# Patient Record
Sex: Male | Born: 1975 | Race: White | Hispanic: No | Marital: Married | Smoking: Never smoker
Health system: Southern US, Community
[De-identification: ages and names within clinical notes are randomized; demographics above are authoritative.]

## PROBLEM LIST (undated history)

## (undated) DIAGNOSIS — E538 Deficiency of other specified B group vitamins: Secondary | ICD-10-CM

## (undated) DIAGNOSIS — Q2543 Congenital aneurysm of aorta: Secondary | ICD-10-CM

## (undated) DIAGNOSIS — I719 Aortic aneurysm of unspecified site, without rupture: Secondary | ICD-10-CM

## (undated) DIAGNOSIS — I491 Atrial premature depolarization: Secondary | ICD-10-CM

## (undated) DIAGNOSIS — K760 Fatty (change of) liver, not elsewhere classified: Secondary | ICD-10-CM

## (undated) DIAGNOSIS — Z823 Family history of stroke: Secondary | ICD-10-CM

## (undated) DIAGNOSIS — I493 Ventricular premature depolarization: Secondary | ICD-10-CM

## (undated) DIAGNOSIS — I2510 Atherosclerotic heart disease of native coronary artery without angina pectoris: Secondary | ICD-10-CM

## (undated) DIAGNOSIS — I10 Essential (primary) hypertension: Secondary | ICD-10-CM

## (undated) DIAGNOSIS — I7121 Aneurysm of the ascending aorta, without rupture: Secondary | ICD-10-CM

## (undated) HISTORY — DX: Family history of stroke: Z82.3

## (undated) HISTORY — DX: Deficiency of other specified B group vitamins: E53.8

## (undated) HISTORY — DX: Atherosclerotic heart disease of native coronary artery without angina pectoris: I25.10

## (undated) HISTORY — PX: WISDOM TOOTH EXTRACTION: SHX21

## (undated) HISTORY — DX: Atrial premature depolarization: I49.1

## (undated) HISTORY — DX: Fatty (change of) liver, not elsewhere classified: K76.0

## (undated) HISTORY — PX: KNEE ARTHROSCOPY: SUR90

## (undated) HISTORY — DX: Essential (primary) hypertension: I10

## (undated) HISTORY — DX: Ventricular premature depolarization: I49.3

---

## 2005-06-10 ENCOUNTER — Encounter: Admission: RE | Admit: 2005-06-10 | Discharge: 2005-06-10 | Payer: Self-pay | Admitting: Family Medicine

## 2017-05-12 DIAGNOSIS — M25562 Pain in left knee: Secondary | ICD-10-CM | POA: Diagnosis not present

## 2017-05-21 DIAGNOSIS — M25562 Pain in left knee: Secondary | ICD-10-CM | POA: Diagnosis not present

## 2017-08-12 DIAGNOSIS — M1712 Unilateral primary osteoarthritis, left knee: Secondary | ICD-10-CM | POA: Diagnosis not present

## 2017-08-14 DIAGNOSIS — E538 Deficiency of other specified B group vitamins: Secondary | ICD-10-CM | POA: Diagnosis not present

## 2017-08-14 DIAGNOSIS — R002 Palpitations: Secondary | ICD-10-CM | POA: Diagnosis not present

## 2017-08-14 DIAGNOSIS — I10 Essential (primary) hypertension: Secondary | ICD-10-CM | POA: Diagnosis not present

## 2017-08-15 ENCOUNTER — Telehealth: Payer: Self-pay

## 2017-08-15 NOTE — Telephone Encounter (Signed)
SENT NOTES TO SCHEDULING 

## 2017-08-19 DIAGNOSIS — M1712 Unilateral primary osteoarthritis, left knee: Secondary | ICD-10-CM | POA: Diagnosis not present

## 2017-08-26 DIAGNOSIS — M1712 Unilateral primary osteoarthritis, left knee: Secondary | ICD-10-CM | POA: Diagnosis not present

## 2017-09-04 ENCOUNTER — Encounter: Payer: Self-pay | Admitting: Cardiology

## 2017-09-04 ENCOUNTER — Encounter (INDEPENDENT_AMBULATORY_CARE_PROVIDER_SITE_OTHER): Payer: Self-pay

## 2017-09-04 ENCOUNTER — Ambulatory Visit: Payer: BLUE CROSS/BLUE SHIELD | Admitting: Cardiology

## 2017-09-04 VITALS — BP 120/100 | HR 71 | Ht 70.25 in | Wt 190.0 lb

## 2017-09-04 DIAGNOSIS — Z8249 Family history of ischemic heart disease and other diseases of the circulatory system: Secondary | ICD-10-CM

## 2017-09-04 DIAGNOSIS — R002 Palpitations: Secondary | ICD-10-CM | POA: Diagnosis not present

## 2017-09-04 DIAGNOSIS — R0609 Other forms of dyspnea: Secondary | ICD-10-CM

## 2017-09-04 DIAGNOSIS — I10 Essential (primary) hypertension: Secondary | ICD-10-CM | POA: Diagnosis not present

## 2017-09-04 NOTE — Progress Notes (Signed)
ekg 

## 2017-09-04 NOTE — Patient Instructions (Addendum)
Medication Instructions:   Your physician recommends that you continue on your current medications as directed. Please refer to the Current Medication list given to you today.     Testing/Procedures:  Your physician has requested that you have an echocardiogram. Echocardiography is a painless test that uses sound waves to create images of your heart. It provides your doctor with information about the size and shape of your heart and how well your heart's chambers and valves are working. This procedure takes approximately one hour. There are no restrictions for this procedure.   Your physician has recommended that you wear a 48 HOUR holter monitor. Holter monitors are medical devices that record the heart's electrical activity. Doctors most often use these monitors to diagnose arrhythmias. Arrhythmias are problems with the speed or rhythm of the heartbeat. The monitor is a small, portable device. You can wear one while you do your normal daily activities. This is usually used to diagnose what is causing palpitations/syncope (passing out).    Follow-Up:  3 MONTHS WITH DR Jens Som ON 10:40 AM END SLOT ON 12/11/17       If you need a refill on your cardiac medications before your next appointment, please call your pharmacy.

## 2017-09-04 NOTE — Progress Notes (Signed)
Cardiology Office Note:    Date:  09/04/2017   ID:  William Garrison, DOB 10/18/1975, MRN [id number]  PCP:  London Pepper, MD  Cardiologist:  No primary care provider on file.   Referring MD: London Pepper, MD   Chief Complaint  Patient presents with  . Palpitations   History of Present Illness:    William Garrison is a 42 y.o. male with a hx of hypertension, who is being referred by her primary care physician for palpitations. He has not noticed them for a couple years now, they're intermittent, sometimes they can last on and off for several hours like yesterday, and sometimes associated with shortness of breath. He exercises daily and doesn't have any chest pain or shortness of breath during exercise, he would feel palpitation mostly in the post exercise period. He denies any syncope but his palpitations are very comfortable he feels like to take over his breath sometimes he felt dizzy. His father had stents placed in his 75s, and also has ascending aortic aneurysm. His grandfather had a stroke in his 89s. He has never smoked.  Past Medical History:  Diagnosis Date  . Family history of stroke   . Hypertension   . Vitamin B 12 deficiency     No past surgical history on file.  Current Medications: Current Meds  Medication Sig  . Cyanocobalamin (VITAMIN B 12 PO) Take 2,500 mcg by mouth daily.  . Desloratadine (CLARINEX PO) Take by mouth.  . Ibuprofen (ADVIL PO) Take by mouth.  Marland Kitchen lisinopril (PRINIVIL,ZESTRIL) 10 MG tablet Take 10 mg by mouth daily.     Allergies:   Patient has no known allergies.   Social History   Socioeconomic History  . Marital status: Married    Spouse name: None  . Number of children: None  . Years of education: None  . Highest education level: None  Social Needs  . Financial resource strain: None  . Food insecurity - worry: None  . Food insecurity - inability: None  . Transportation needs - medical: None  . Transportation needs - non-medical: None    Occupational History  . None  Tobacco Use  . Smoking status: Never Smoker  . Smokeless tobacco: Never Used  Substance and Sexual Activity  . Alcohol use: Yes    Alcohol/week: 4.2 - 8.4 oz    Types: 7 - 14 Glasses of wine per week  . Drug use: No  . Sexual activity: None    Comment: married  Other Topics Concern  . None  Social History Narrative  . None     Family History: The patient's family history includes Aortic aneurysm in his father; Hypercalcemia in his mother; Hypertension in his father and mother; Stroke in his paternal grandfather.  ROS:   Please see the history of present illness.    All other systems reviewed and are negative.  EKGs/Labs/Other Studies Reviewed:    The following studies were reviewed today:  EKG:  EKG is ordered today.  The ekg ordered today demonstrates normal sinus rhythm, normal EKG, no prior EKG available for comparison  Recent Labs: No results found for requested labs within last 8760 hours.  Recent Lipid Panel No results found for: CHOL, TRIG, HDL, CHOLHDL, VLDL, LDLCALC, LDLDIRECT  Physical Exam:    VS:  BP (!) 120/100 (BP Location: Left Arm, Patient Position: Sitting, Cuff Size: Normal)   Pulse 71   Ht 5' 10.25" (1.784 m)   Wt 190 lb (86.2 kg)   SpO2 99%  BMI 27.07 kg/m     Wt Readings from Last 3 Encounters:  09/04/17 190 lb (86.2 kg)     GEN: Well nourished, well developed in no acute distress HEENT: Normal NECK: No JVD; No carotid bruits LYMPHATICS: No lymphadenopathy CARDIAC: RRR, no murmurs, rubs, gallops RESPIRATORY:  Clear to auscultation without rales, wheezing or rhonchi  ABDOMEN: Soft, non-tender, non-distended MUSCULOSKELETAL:  No edema; No deformity  SKIN: Warm and dry NEUROLOGIC:  Alert and oriented x 3 PSYCHIATRIC:  Normal affect   ASSESSMENT:    1. Palpitations   2. DOE (dyspnea on exertion)   3. Essential hypertension   4. Family history of early CAD    PLAN:    In order of problems listed  above:  1. Palpitations, most probably PVCs, he brought blood work from his primary care physician, all of his labs are normal including electrolytes kidney function and TSH. We will obtain a 48-hour Holter monitor to further evaluate. We will also obtain echocardiogram to evaluate for his LVEF and also evaluate for possible ascending aortic aneurysm as his family history in his father. 2. Dyspnea on exertion, only associated with palpitations, we will not proceed with stress test unless abnormal echocardiogram. 3. Hypertension - well-controlled with lisinopril. 4. Lipids were checked previously and all at goal.   Medication Adjustments/Labs and Tests Ordered: Current medicines are reviewed at length with the patient today.  Concerns regarding medicines are outlined above.  Orders Placed This Encounter  Procedures  . Holter monitor - 48 hour  . EKG 12-Lead  . ECHOCARDIOGRAM COMPLETE   No orders of the defined types were placed in this encounter.   Signed, Ena Dawley, MD  09/04/2017 9:32 AM    [facility]

## 2017-09-09 DIAGNOSIS — J209 Acute bronchitis, unspecified: Secondary | ICD-10-CM | POA: Diagnosis not present

## 2017-09-09 DIAGNOSIS — R509 Fever, unspecified: Secondary | ICD-10-CM | POA: Diagnosis not present

## 2017-09-16 ENCOUNTER — Other Ambulatory Visit: Payer: Self-pay

## 2017-09-16 ENCOUNTER — Ambulatory Visit (INDEPENDENT_AMBULATORY_CARE_PROVIDER_SITE_OTHER): Payer: BLUE CROSS/BLUE SHIELD

## 2017-09-16 ENCOUNTER — Ambulatory Visit (HOSPITAL_COMMUNITY): Payer: BLUE CROSS/BLUE SHIELD | Attending: Cardiovascular Disease

## 2017-09-16 DIAGNOSIS — I10 Essential (primary) hypertension: Secondary | ICD-10-CM | POA: Insufficient documentation

## 2017-09-16 DIAGNOSIS — R0600 Dyspnea, unspecified: Secondary | ICD-10-CM | POA: Diagnosis not present

## 2017-09-16 DIAGNOSIS — R0609 Other forms of dyspnea: Secondary | ICD-10-CM

## 2017-09-16 DIAGNOSIS — R002 Palpitations: Secondary | ICD-10-CM

## 2017-09-17 ENCOUNTER — Telehealth: Payer: Self-pay | Admitting: Cardiology

## 2017-09-17 NOTE — Telephone Encounter (Signed)
William Garrison is returning a call . Thanks

## 2017-09-17 NOTE — Telephone Encounter (Signed)
Notes recorded by Nuala Alpha, LPN on 1/96/2229 at 7:98 AM EDT Notified the pt of echo results per Dr Meda Coffee. Pt verbalized understanding.

## 2017-10-22 DIAGNOSIS — M1712 Unilateral primary osteoarthritis, left knee: Secondary | ICD-10-CM | POA: Diagnosis not present

## 2017-12-11 ENCOUNTER — Ambulatory Visit: Payer: BLUE CROSS/BLUE SHIELD | Admitting: Cardiology

## 2018-01-13 DIAGNOSIS — R002 Palpitations: Secondary | ICD-10-CM | POA: Insufficient documentation

## 2018-01-13 DIAGNOSIS — Z8249 Family history of ischemic heart disease and other diseases of the circulatory system: Secondary | ICD-10-CM | POA: Insufficient documentation

## 2018-01-13 NOTE — Progress Notes (Signed)
Cardiology Office Note    Date:  01/14/2018   ID:  William Garrison, DOB [birth year], MRN [id number]  PCP:  London Pepper, MD  Cardiologist: Ena Dawley, MD  Chief Complaint  Patient presents with  . Follow-up    History of Present Illness:  William Garrison is a 42 y.o. 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   Patient was seen by Dr. Meda Coffee 08/2017 2D echo showed normal LV function, mildly dilated aortic root, trace of AI MR and TR.  48-hour monitor showed sinus bradycardia to sinus tachycardia with occasional PACs and PVCs felt to be essentially normal.  Patient comes in today for follow-up.  He continues to have daily palpitations but not as much as he had in the past.  He is not interested in a beta-blocker as his blood pressure is well controlled on lisinopril.  He works out [employer] camp and other activities regularly.  Previously ran marathons but can no longer do that because of knee problems.  Denies chest pain, dyspnea, dizziness or presyncope.  Only drinks 1 cup of coffee daily.    Past Medical History:  Diagnosis Date  . Family history of stroke   . Hypertension   . Vitamin B 12 deficiency     History reviewed. No pertinent surgical history.  Current Medications: Current Meds  Medication Sig  . Cyanocobalamin (VITAMIN B 12 PO) Take 2,500 mcg by mouth daily.  . Desloratadine (CLARINEX PO) Take by mouth.  . Ibuprofen (ADVIL PO) Take by mouth.  Marland Kitchen lisinopril (PRINIVIL,ZESTRIL) 10 MG tablet Take 10 mg by mouth daily.     Allergies:   Patient has no known allergies.   Social History   Socioeconomic History  . Marital status: Married    Spouse name: Not on file  . Number of children: Not on file  . Years of education: Not on file  . Highest education level: Not on file  Occupational  History  . Not on file  Social Needs  . Financial resource strain: Not on file  . Food insecurity:    Worry: Not on file    Inability: Not on file  . Transportation needs:    Medical: Not on file    Non-medical: Not on file  Tobacco Use  . Smoking status: Never Smoker  . Smokeless tobacco: Never Used  Substance and Sexual Activity  . Alcohol use: Yes    Alcohol/week: 4.2 - 8.4 oz    Types: 7 - 14 Glasses of wine per week  . Drug use: No  . Sexual activity: Not on file    Comment: married  Lifestyle  . Physical activity:    Days per week: Not on file    Minutes per session: Not on file  . Stress: Not on file  Relationships  . Social connections:    Talks on phone: Not on file    Gets together: Not on file    Attends religious service: Not on file    Active member of club or organization: Not on file    Attends meetings of clubs or organizations: Not on file    Relationship status: Not on file  Other Topics Concern  . Not on file  Social History Narrative  . Not on file     Family History:  The patient's family history includes Aortic  aneurysm in his father; Hypercalcemia in his mother; Hypertension in his father and mother; Stroke in his paternal grandfather.   ROS:   Please see the history of present illness.    Review of Systems  Constitution: Negative.  HENT: Negative.   Cardiovascular: Positive for palpitations.  Respiratory: Negative.   Endocrine: Negative.   Hematologic/Lymphatic: Negative.   Musculoskeletal: Negative.   Gastrointestinal: Negative.   Genitourinary: Negative.   Neurological: Negative.    All other systems reviewed and are negative.   PHYSICAL EXAM:   VS:  BP 118/72   Pulse 65   Ht 6' (1.829 m)   Wt 180 lb (81.6 kg)   SpO2 98%   BMI 24.41 kg/m   Physical Exam  GEN: Thin, in no acute distress  Neck: no JVD, carotid bruits, or masses Cardiac:RRR; no murmurs, rubs, or gallops  Respiratory:  clear to auscultation bilaterally, normal  work of breathing GI: soft, nontender, nondistended, + BS Ext: without cyanosis, clubbing, or edema, Good distal pulses bilaterally Neuro:  Alert and Oriented x 3 Psych: euthymic mood, full affect  Wt Readings from Last 3 Encounters:  01/14/18 180 lb (81.6 kg)  09/04/17 190 lb (86.2 kg)      Studies/Labs Reviewed:   EKG:  EKG is not ordered today.   Recent Labs: No results found for requested labs within last 8760 hours.   Lipid Panel No results found for: CHOL, TRIG, HDL, CHOLHDL, VLDL, LDLCALC, LDLDIRECT  Additional studies/ records that were reviewed today include:  2D echo 09/16/2017 Study Conclusions   - Left ventricle: The cavity size was normal. Wall thickness was   normal. Systolic function was normal. The estimated ejection   fraction was in the range of 55% to 60%. Wall motion was normal;   there were no regional wall motion abnormalities. Left   ventricular diastolic function parameters were normal. - Aortic valve: There was trivial regurgitation. - Aortic root: The aortic root was mildly dilated. - Mitral valve: Calcified annulus.   Impressions:   - Normal LV function; mildly dilated aortic root; trace AI, MR and   TR.  Holter monitor 09/16/2017 Sinus bradycardia to sinus tachycardia. Ocassional PACs and PVCs. Essentially a normal 48 hour Holter monitor.      ASSESSMENT:    1. Dilated aortic root (HCC)   2. Palpitations   3. Family history of early CAD      PLAN:  In order of problems listed above:  Palpitations associated with dyspnea 2D echo normal LV function, mildly dilated aortic root 48-hour monitor showed PACs and PVCs.  Lipids previously at goal.  Palpitations have improved but still has some on a daily basis.  Not interested in beta-blocker at this time.  Family history of early CAD with family history of early CAD and dilated aortic root on echo will order CT with calcium score for next March and follow-up with Dr. Meda Coffee  Essential  hypertension controlled with lisinopril  Dilated aortic root-monitor yearly  Medication Adjustments/Labs and Tests Ordered: Current medicines are reviewed at length with the patient today.  Concerns regarding medicines are outlined above.  Medication changes, Labs and Tests ordered today are listed in the Patient Instructions below. Patient Instructions  Medication Instructions:  Your physician recommends that you continue on your current medications as directed. Please refer to the Current Medication list given to you today.   Labwork: None ordered today; However you will need lab work done March 2020 1 week before CT.   Testing/Procedures:  Coronary CT-A to be done at [place]: Your physician recommends that you schedule a follow-up appointment with Dr. Meda Coffee after testing is complete in March 2020  Any Other Special Instructions Will Be Listed Below (If Applicable).  If you need a refill on your cardiac medications before your next appointment, please call your pharmacy.  Please arrive at the [facility] main entrance of [facility] at xx:xx AM (30-45 minutes prior to test start time)  [facility] [place], [place] [address] [phone]  Proceed to the [facility] Radiology Department (First Floor).  Please follow these instructions carefully (unless otherwise directed):  Hold all erectile dysfunction medications at least 48 hours prior to test.  On the Night Before the Test: . Drink plenty of water. . Do not consume any caffeinated/decaffeinated beverages or chocolate 12 hours prior to your test. . Do not take any antihistamines 12 hours prior to your test. . If you take Metformin do not take 24 hours prior to test. . If the patient has contrast allergy: ? Patient will need a prescription for Prednisone and very clear instructions (as follows): 1. Prednisone 50 mg - take 13 hours prior to test 2. Take another  Prednisone 50 mg 7 hours prior to test 3. Take another Prednisone 50 mg 1 hour prior to test 4. Take Benadryl 50 mg 1 hour prior to test . Patient must complete all four doses of above prophylactic medications. . Patient will need a ride after test due to Benadryl.  On the Day of the Test: . Drink plenty of water. Do not drink any water within one hour of the test. . Do not eat any food 4 hours prior to the test. . You may take your regular medications prior to the test. . IF NOT ON A BETA BLOCKER - Take 50 mg of lopressor (metoprolol) one hour before the test.   After the Test: . Drink plenty of water. . After receiving IV contrast, you may experience a mild flushed feeling. This is normal. . On occasion, you may experience a mild rash up to 24 hours after the test. This is not dangerous. If this occurs, you can take Benadryl 25 mg and increase your fluid intake. . If you experience trouble breathing, this can be serious. If it is severe call 911 IMMEDIATELY. If it is mild, please call our office. . If you take any of these medications: Glipizide/Metformin, Avandament, Glucavance, please do not take 48 hours after completing test.       Signed, Ermalinda Barrios, PA-C  01/14/2018 10:26 AM    [address], [place], [place]  [address] Phone: [phone]; Fax: [phone]

## 2018-01-14 ENCOUNTER — Ambulatory Visit: Payer: BLUE CROSS/BLUE SHIELD | Admitting: Physician Assistant

## 2018-01-14 ENCOUNTER — Encounter: Payer: Self-pay | Admitting: Physician Assistant

## 2018-01-14 VITALS — BP 118/72 | HR 65 | Ht 72.0 in | Wt 180.0 lb

## 2018-01-14 DIAGNOSIS — R002 Palpitations: Secondary | ICD-10-CM | POA: Diagnosis not present

## 2018-01-14 DIAGNOSIS — I77810 Thoracic aortic ectasia: Secondary | ICD-10-CM

## 2018-01-14 DIAGNOSIS — Z8249 Family history of ischemic heart disease and other diseases of the circulatory system: Secondary | ICD-10-CM

## 2018-01-14 MED ORDER — METOPROLOL TARTRATE 50 MG PO TABS: 50.0000 mg | ORAL_TABLET | Freq: Once | ORAL | 0 refills | Status: DC

## 2018-01-14 NOTE — Patient Instructions (Addendum)
Medication Instructions:  Your physician recommends that you continue on your current medications as directed. Please refer to the Current Medication list given to you today.   Labwork: None ordered today; However you will need lab work done March 2020 1 week before CT.   Testing/Procedures: Coronary CT-A to be done at [place]: Your physician recommends that you schedule a follow-up appointment with Dr. Meda Coffee after testing is complete in March 2020  Any Other Special Instructions Will Be Listed Below (If Applicable).  If you need a refill on your cardiac medications before your next appointment, please call your pharmacy.  Please arrive at the [facility] main entrance of [facility] at xx:xx AM (30-45 minutes prior to test start time)  [facility] [address] [place], [place] [address] [phone]  Proceed to the [facility] Radiology Department (First Floor).  Please follow these instructions carefully (unless otherwise directed):  Hold all erectile dysfunction medications at least 48 hours prior to test.  On the Night Before the Test: . Drink plenty of water. . Do not consume any caffeinated/decaffeinated beverages or chocolate 12 hours prior to your test. . Do not take any antihistamines 12 hours prior to your test. . If you take Metformin do not take 24 hours prior to test. . If the patient has contrast allergy: ? Patient will need a prescription for Prednisone and very clear instructions (as follows): 1. Prednisone 50 mg - take 13 hours prior to test 2. Take another Prednisone 50 mg 7 hours prior to test 3. Take another Prednisone 50 mg 1 hour prior to test 4. Take Benadryl 50 mg 1 hour prior to test . Patient must complete all four doses of above prophylactic medications. . Patient will need a ride after test due to Benadryl.  On the Day of the Test: . Drink plenty of water. Do not drink any water within one hour  of the test. . Do not eat any food 4 hours prior to the test. . You may take your regular medications prior to the test. . IF NOT ON A BETA BLOCKER - Take 50 mg of lopressor (metoprolol) one hour before the test.   After the Test: . Drink plenty of water. . After receiving IV contrast, you may experience a mild flushed feeling. This is normal. . On occasion, you may experience a mild rash up to 24 hours after the test. This is not dangerous. If this occurs, you can take Benadryl 25 mg and increase your fluid intake. . If you experience trouble breathing, this can be serious. If it is severe call 911 IMMEDIATELY. If it is mild, please call our office. . If you take any of these medications: Glipizide/Metformin, Avandament, Glucavance, please do not take 48 hours after completing test.

## 2018-03-18 DIAGNOSIS — M1712 Unilateral primary osteoarthritis, left knee: Secondary | ICD-10-CM | POA: Diagnosis not present

## 2018-03-25 DIAGNOSIS — M1712 Unilateral primary osteoarthritis, left knee: Secondary | ICD-10-CM | POA: Diagnosis not present

## 2018-04-01 DIAGNOSIS — M1712 Unilateral primary osteoarthritis, left knee: Secondary | ICD-10-CM | POA: Diagnosis not present

## 2018-05-11 DIAGNOSIS — M1712 Unilateral primary osteoarthritis, left knee: Secondary | ICD-10-CM | POA: Diagnosis not present

## 2018-06-28 DIAGNOSIS — S62309A Unspecified fracture of unspecified metacarpal bone, initial encounter for closed fracture: Secondary | ICD-10-CM | POA: Diagnosis not present

## 2018-06-28 DIAGNOSIS — Z23 Encounter for immunization: Secondary | ICD-10-CM | POA: Diagnosis not present

## 2018-06-29 DIAGNOSIS — M7989 Other specified soft tissue disorders: Secondary | ICD-10-CM | POA: Diagnosis not present

## 2018-06-29 DIAGNOSIS — S62336A Displaced fracture of neck of fifth metacarpal bone, right hand, initial encounter for closed fracture: Secondary | ICD-10-CM | POA: Diagnosis not present

## 2018-06-29 DIAGNOSIS — S62336D Displaced fracture of neck of fifth metacarpal bone, right hand, subsequent encounter for fracture with routine healing: Secondary | ICD-10-CM | POA: Diagnosis not present

## 2018-06-29 DIAGNOSIS — M79641 Pain in right hand: Secondary | ICD-10-CM | POA: Diagnosis not present

## 2018-07-09 DIAGNOSIS — S62336A Displaced fracture of neck of fifth metacarpal bone, right hand, initial encounter for closed fracture: Secondary | ICD-10-CM | POA: Diagnosis not present

## 2018-07-23 DIAGNOSIS — S62336A Displaced fracture of neck of fifth metacarpal bone, right hand, initial encounter for closed fracture: Secondary | ICD-10-CM | POA: Diagnosis not present

## 2018-07-30 DIAGNOSIS — J069 Acute upper respiratory infection, unspecified: Secondary | ICD-10-CM | POA: Diagnosis not present

## 2018-08-12 DIAGNOSIS — M79671 Pain in right foot: Secondary | ICD-10-CM | POA: Diagnosis not present

## 2018-08-18 DIAGNOSIS — S62336A Displaced fracture of neck of fifth metacarpal bone, right hand, initial encounter for closed fracture: Secondary | ICD-10-CM | POA: Diagnosis not present

## 2018-09-02 MED ORDER — METOPROLOL TARTRATE 50 MG PO TABS: 50.0000 mg | ORAL_TABLET | Freq: Once | ORAL | 0 refills | Status: DC

## 2018-09-03 ENCOUNTER — Other Ambulatory Visit: Payer: BLUE CROSS/BLUE SHIELD | Admitting: *Deleted

## 2018-09-03 DIAGNOSIS — I77810 Thoracic aortic ectasia: Secondary | ICD-10-CM | POA: Diagnosis not present

## 2018-09-03 DIAGNOSIS — Z8249 Family history of ischemic heart disease and other diseases of the circulatory system: Secondary | ICD-10-CM

## 2018-09-03 LAB — BASIC METABOLIC PANEL
BUN / CREAT RATIO: 10 (ref 9–20)
BUN: 9 mg/dL (ref 6–24)
CALCIUM: 9.9 mg/dL (ref 8.7–10.2)
CO2: 24 mmol/L (ref 20–29)
Chloride: 98 mmol/L (ref 96–106)
Creatinine, Ser: 0.9 mg/dL (ref 0.76–1.27)
GFR calc Af Amer: 121 mL/min/{1.73_m2} (ref >59)
GFR calc non Af Amer: 105 mL/min/{1.73_m2} (ref >59)
Glucose: 90 mg/dL (ref 65–99)
Potassium: 4.7 mmol/L (ref 3.5–5.2)
SODIUM: 138 mmol/L (ref 134–144)

## 2018-09-07 ENCOUNTER — Telehealth (HOSPITAL_COMMUNITY): Payer: Self-pay | Admitting: Emergency Medicine

## 2018-09-07 NOTE — Telephone Encounter (Signed)
Reaching out to patient to offer assistance regarding upcoming cardiac imaging study; pt verbalizes understanding of appt date/time, parking situation and where to check in, pre-test NPO status and medications ordered, and verified current allergies; name and call back number provided for further questions should they arise Akyra Bouchie RN Navigator Cardiac Imaging [facility] [phone] office [phone] cell 

## 2018-09-08 ENCOUNTER — Encounter: Payer: BLUE CROSS/BLUE SHIELD | Admitting: *Deleted

## 2018-09-08 ENCOUNTER — Ambulatory Visit (HOSPITAL_COMMUNITY)
Admission: RE | Admit: 2018-09-08 | Discharge: 2018-09-08 | Disposition: A | Discharge status: Home / Self Care | Payer: BLUE CROSS/BLUE SHIELD | Source: Ambulatory Visit | Attending: Physician Assistant | Admitting: Physician Assistant

## 2018-09-08 DIAGNOSIS — Z8249 Family history of ischemic heart disease and other diseases of the circulatory system: Secondary | ICD-10-CM | POA: Diagnosis not present

## 2018-09-08 DIAGNOSIS — Z006 Encounter for examination for normal comparison and control in clinical research program: Secondary | ICD-10-CM

## 2018-09-08 DIAGNOSIS — I77810 Thoracic aortic ectasia: Secondary | ICD-10-CM | POA: Insufficient documentation

## 2018-09-08 MED ORDER — NITROGLYCERIN 0.4 MG SL SUBL
0.8000 mg | SUBLINGUAL_TABLET | SUBLINGUAL | Status: DC | PRN
  Administered 2018-09-08: 0.8 mg via SUBLINGUAL
  Filled 2018-09-08 (×2): qty 25

## 2018-09-08 MED ORDER — IOPAMIDOL (ISOVUE-370) INJECTION 76%
80.0000 mL | Freq: Once | INTRAVENOUS | Status: AC | PRN
  Administered 2018-09-08: 80 mL via INTRAVENOUS

## 2018-09-08 MED ORDER — NITROGLYCERIN 0.4 MG SL SUBL
SUBLINGUAL_TABLET | SUBLINGUAL | Status: AC
  Filled 2018-09-08: qty 2

## 2018-09-08 MED ORDER — METOPROLOL TARTRATE 5 MG/5ML IV SOLN
INTRAVENOUS | Status: AC
  Filled 2018-09-08: qty 5

## 2018-09-08 NOTE — Research (Signed)
CADFEM Informed Consent                  Subject Name:   William Garrison   Subject met inclusion and exclusion criteria.  The informed consent form, study requirements and expectations were reviewed with the subject and questions and concerns were addressed prior to the signing of the consent form.  The subject verbalized understanding of the trial requirements.  The subject agreed to participate in the CADFEM trial and signed the informed consent.  The informed consent was obtained prior to performance of any protocol-specific procedures for the subject.  A copy of the signed informed consent was given to the subject and a copy was placed in the subject's medical record.   [place] Chalmers, Research Assistant 09/08/2018   06:45 a.am

## 2018-09-09 ENCOUNTER — Other Ambulatory Visit: Payer: Self-pay

## 2018-09-09 DIAGNOSIS — I77810 Thoracic aortic ectasia: Secondary | ICD-10-CM

## 2018-09-09 DIAGNOSIS — R931 Abnormal findings on diagnostic imaging of heart and coronary circulation: Secondary | ICD-10-CM

## 2018-09-09 MED ORDER — ATORVASTATIN CALCIUM 20 MG PO TABS: 20.0000 mg | ORAL_TABLET | Freq: Every day | ORAL | 3 refills | Status: DC

## 2018-09-14 DIAGNOSIS — Z0000 Encounter for general adult medical examination without abnormal findings: Secondary | ICD-10-CM | POA: Diagnosis not present

## 2018-09-14 DIAGNOSIS — Z125 Encounter for screening for malignant neoplasm of prostate: Secondary | ICD-10-CM | POA: Diagnosis not present

## 2018-09-14 DIAGNOSIS — I10 Essential (primary) hypertension: Secondary | ICD-10-CM | POA: Diagnosis not present

## 2018-09-14 DIAGNOSIS — I719 Aortic aneurysm of unspecified site, without rupture: Secondary | ICD-10-CM | POA: Diagnosis not present

## 2018-09-28 ENCOUNTER — Ambulatory Visit: Payer: BLUE CROSS/BLUE SHIELD | Admitting: Physician Assistant

## 2018-12-07 ENCOUNTER — Other Ambulatory Visit: Payer: Self-pay

## 2018-12-07 ENCOUNTER — Other Ambulatory Visit: Payer: BLUE CROSS/BLUE SHIELD | Admitting: *Deleted

## 2018-12-07 DIAGNOSIS — I77810 Thoracic aortic ectasia: Secondary | ICD-10-CM

## 2018-12-07 DIAGNOSIS — R931 Abnormal findings on diagnostic imaging of heart and coronary circulation: Secondary | ICD-10-CM

## 2018-12-07 LAB — LIPID PANEL
Chol/HDL Ratio: 1.6 ratio (ref 0.0–5.0)
Cholesterol, Total: 254 mg/dL — ABNORMAL HIGH (ref 100–199)
HDL: 154 mg/dL (ref >39)
LDL Calculated: 83 mg/dL (ref 0–99)
Triglycerides: 87 mg/dL (ref 0–149)
VLDL Cholesterol Cal: 17 mg/dL (ref 5–40)

## 2018-12-07 LAB — HEPATIC FUNCTION PANEL
ALT: 196 IU/L — ABNORMAL HIGH (ref 0–44)
AST: 158 IU/L — ABNORMAL HIGH (ref 0–40)
Albumin: 4.8 g/dL (ref 4.0–5.0)
Alkaline Phosphatase: 73 IU/L (ref 39–117)
Bilirubin Total: 0.9 mg/dL (ref 0.0–1.2)
Bilirubin, Direct: 0.32 mg/dL (ref 0.00–0.40)
Total Protein: 6.9 g/dL (ref 6.0–8.5)

## 2019-04-29 DIAGNOSIS — M25562 Pain in left knee: Secondary | ICD-10-CM | POA: Diagnosis not present

## 2019-04-29 DIAGNOSIS — M2410 Other articular cartilage disorders, unspecified site: Secondary | ICD-10-CM | POA: Diagnosis not present

## 2019-04-29 DIAGNOSIS — M1712 Unilateral primary osteoarthritis, left knee: Secondary | ICD-10-CM | POA: Diagnosis not present

## 2019-05-07 DIAGNOSIS — M1712 Unilateral primary osteoarthritis, left knee: Secondary | ICD-10-CM | POA: Diagnosis not present

## 2019-05-10 DIAGNOSIS — Z20828 Contact with and (suspected) exposure to other viral communicable diseases: Secondary | ICD-10-CM | POA: Diagnosis not present

## 2019-05-12 DIAGNOSIS — R509 Fever, unspecified: Secondary | ICD-10-CM | POA: Diagnosis not present

## 2019-05-13 DIAGNOSIS — R509 Fever, unspecified: Secondary | ICD-10-CM | POA: Diagnosis not present

## 2019-06-06 DIAGNOSIS — K4090 Unilateral inguinal hernia, without obstruction or gangrene, not specified as recurrent: Secondary | ICD-10-CM | POA: Diagnosis not present

## 2019-06-18 ENCOUNTER — Ambulatory Visit: Payer: Self-pay | Admitting: Surgery

## 2019-06-18 DIAGNOSIS — K4090 Unilateral inguinal hernia, without obstruction or gangrene, not specified as recurrent: Secondary | ICD-10-CM | POA: Diagnosis not present

## 2019-06-18 NOTE — H&P (Signed)
William Garrison [facility] SR Documented: 06/18/2019 10:05 AM Location: [facility] Patient #: [name] DOB: 14-Dec-1975 Married / Language: Cleophus Garrison / Race: White Male  History of Present Illness William Garrison A. Trachelle Low MD; 06/18/2019 1:02 PM) Patient words: Patent patient presents for right groin pain and swelling. This was first noticed about 3 weeks ago. Pain is described right groin is sharp and burning in nature. It is made worse with coughing or sneezing. It is associated with a bulge in his right groin up pops in and pops out. He denies any associated bowel or bladder problem.  The patient is a 43 year old male.   Past Surgical History William Garrison, [place]; 06/18/2019 10:05 AM) Knee Surgery Left.  Diagnostic Studies History William Garrison, [place]; 06/18/2019 10:05 AM) Colonoscopy never  Allergies William Garrison, CMA; 06/18/2019 10:06 AM) No Known Drug Allergies [06/18/2019]: Allergies Reconciled  Medication History William Garrison, CMA; 06/18/2019 10:06 AM) Atorvastatin Calcium (20MG  Tablet, Oral) Active. Lisinopril (10MG  Tablet, Oral) Active. Medications Reconciled  Other Problems William Garrison, [place]; 06/18/2019 10:05 AM) High blood pressure     Review of Systems William Garrison CMA; 06/18/2019 10:05 AM) General Not Present- Appetite Loss, Chills, Fatigue, Fever, Night Sweats, Weight Gain and Weight Loss. Skin Not Present- Change in Wart/Mole, Dryness, Hives, Jaundice, New Lesions, Non-Healing Wounds, Rash and Ulcer. HEENT Not Present- Earache, Hearing Loss, Hoarseness, Nose Bleed, Oral Ulcers, Ringing in the Ears, Seasonal Allergies, Sinus Pain, Sore Throat, Visual Disturbances, Wears glasses/contact lenses and Yellow Eyes. Respiratory Not Present- Bloody sputum, Chronic Cough, Difficulty Breathing, Snoring and Wheezing. Breast Not Present- Breast Mass, Breast Pain, Nipple Discharge and Skin Changes. Cardiovascular Not Present- Chest Pain, Difficulty  Breathing Lying Down, Leg Cramps, Palpitations, Rapid Heart Rate, Shortness of Breath and Swelling of Extremities. Gastrointestinal Present- Abdominal Pain. Not Present- Bloating, Bloody Stool, Change in Bowel Habits, Chronic diarrhea, Constipation, Difficulty Swallowing, Excessive gas, Gets full quickly at meals, Hemorrhoids, Indigestion, Nausea, Rectal Pain and Vomiting. Male Genitourinary Not Present- Blood in Urine, Change in Urinary Stream, Frequency, Impotence, Nocturia, Painful Urination, Urgency and Urine Leakage. Musculoskeletal Not Present- Back Pain, Joint Pain, Joint Stiffness, Muscle Pain, Muscle Weakness and Swelling of Extremities. Neurological Not Present- Decreased Memory, Fainting, Headaches, Numbness, Seizures, Tingling, Tremor, Trouble walking and Weakness. Psychiatric Not Present- Anxiety, Bipolar, Change in Sleep Pattern, Depression, Fearful and Frequent crying. Endocrine Not Present- Cold Intolerance, Excessive Hunger, Hair Changes, Heat Intolerance, Hot flashes and New Diabetes. Hematology Not Present- Blood Thinners, Easy Bruising, Excessive bleeding, Gland problems, HIV and Persistent Infections.  Vitals William Garrison CMA; 06/18/2019 10:06 AM) 06/18/2019 10:06 AM Weight: 190.4 lb Height: 71in Body Surface Area: 2.07 m Body Mass Index: 26.56 kg/m  Temp.: 98.27F  Pulse: 104 (Regular)  BP: 142/84 (Sitting, Left Arm, Standard)        Physical Exam (William Garrison A. Marchella Hibbard MD; 06/18/2019 1:03 PM)  General Mental Status-Alert. General Appearance-Consistent with stated age. Hydration-Well hydrated. Voice-Normal.  Head and Neck Head-normocephalic, atraumatic with no lesions or palpable masses. Trachea-midline. Thyroid Gland Characteristics - normal size and consistency.  Cardiovascular Note: Normal sinus rhythm  Abdomen Note: Reducible right inguinal hernia. No evidence of left inguinal hernia. Soft nontender without rebound or  guarding  Neurologic Neurologic evaluation reveals -alert and oriented x 3 with no impairment of recent or remote memory. Mental Status-Normal.  Musculoskeletal Normal Exam - Left-Upper Extremity Strength Normal and Lower Extremity Strength Normal. Normal Exam - Right-Upper Extremity Strength Normal and Lower Extremity Strength Normal.    Assessment & Plan William Garrison  A. William Courville MD; 06/18/2019 1:04 PM)  RIGHT INGUINAL HERNIA (K40.90) Impression: Discussed laparoscopic and open techniques with mesh. Discussed chronic pain. The patient desires to proceed with open right inguinal hernia repair with mesh. The risk of hernia repair include bleeding, infection, organ injury, bowel injury, bladder injury, nerve injury recurrent hernia, blood clots, worsening of underlying condition, chronic pain, mesh use, open surgery, death, and the need for other operations. Pt agrees to proceed  Current Plans You are being scheduled for surgery- Our schedulers will call you.  You should hear from our office's scheduling department within 5 working days about the location, date, and time of surgery. We try to make accommodations for patient's preferences in scheduling surgery, but sometimes the OR schedule or the surgeon's schedule prevents [name] from making those accommodations.  If you have not heard from our office [phone]) in 5 working days, call the office and ask for your surgeon's nurse.  If you have other questions about your diagnosis, plan, or surgery, call the office and ask for your surgeon's nurse.  Pt Education - Pamphlet Given - Hernia Surgery: discussed with patient and provided information. Pt Education - CCS Mesh education: discussed with patient and provided information. The anatomy & physiology of the abdominal wall and pelvic floor was discussed. The pathophysiology of hernias in the inguinal and pelvic region was discussed. Natural history risks such as progressive  enlargement, pain, incarceration, and strangulation was discussed. Contributors to complications such as smoking, obesity, diabetes, prior surgery, etc were discussed.  I feel the risks of no intervention will lead to serious problems that outweigh the operative risks; therefore, I recommended surgery to reduce and repair the hernia. I explained an open approach. I noted usual use of mesh to patch and/or buttress hernia repair  Risks such as bleeding, infection, abscess, need for further treatment, heart attack, death, and other risks were discussed. I noted a good likelihood this will help address the problem. Goals of post-operative recovery were discussed as well. Possibility that this will not correct all symptoms was explained. I stressed the importance of low-impact activity, aggressive pain control, avoiding constipation, & not pushing through pain to minimize risk of post-operative chronic pain or injury. Possibility of reherniation was discussed. We will work to minimize complications.  An educational handout further explaining the pathology & treatment options was given as well. Questions were answered. The patient expresses understanding & wishes to proceed with surgery.

## 2019-07-14 ENCOUNTER — Encounter (HOSPITAL_BASED_OUTPATIENT_CLINIC_OR_DEPARTMENT_OTHER): Payer: Self-pay | Admitting: Surgery

## 2019-07-14 ENCOUNTER — Other Ambulatory Visit: Payer: Self-pay

## 2019-07-15 ENCOUNTER — Other Ambulatory Visit: Payer: Self-pay

## 2019-07-15 ENCOUNTER — Emergency Department (HOSPITAL_COMMUNITY)
Admission: EM | Admit: 2019-07-15 | Discharge: 2019-07-16 | Disposition: A | Discharge status: Home / Self Care | Payer: No Typology Code available for payment source | Attending: Emergency Medicine | Admitting: Emergency Medicine

## 2019-07-15 ENCOUNTER — Emergency Department (HOSPITAL_COMMUNITY): Payer: No Typology Code available for payment source

## 2019-07-15 ENCOUNTER — Encounter (HOSPITAL_COMMUNITY): Payer: Self-pay | Admitting: *Deleted

## 2019-07-15 DIAGNOSIS — I10 Essential (primary) hypertension: Secondary | ICD-10-CM | POA: Diagnosis not present

## 2019-07-15 DIAGNOSIS — N50811 Right testicular pain: Secondary | ICD-10-CM | POA: Diagnosis not present

## 2019-07-15 DIAGNOSIS — R110 Nausea: Secondary | ICD-10-CM | POA: Diagnosis not present

## 2019-07-15 DIAGNOSIS — N453 Epididymo-orchitis: Secondary | ICD-10-CM

## 2019-07-15 DIAGNOSIS — R1030 Lower abdominal pain, unspecified: Secondary | ICD-10-CM | POA: Diagnosis present

## 2019-07-15 DIAGNOSIS — Z79899 Other long term (current) drug therapy: Secondary | ICD-10-CM | POA: Diagnosis not present

## 2019-07-15 LAB — CBC
HCT: 44.2 % (ref 39.0–52.0)
Hemoglobin: 15.1 g/dL (ref 13.0–17.0)
MCH: 32.7 pg (ref 26.0–34.0)
MCHC: 34.2 g/dL (ref 30.0–36.0)
MCV: 95.7 fL (ref 80.0–100.0)
Platelets: 183 10*3/uL (ref 150–400)
RBC: 4.62 MIL/uL (ref 4.22–5.81)
RDW: 13.2 % (ref 11.5–15.5)
WBC: 13.4 10*3/uL — ABNORMAL HIGH (ref 4.0–10.5)
nRBC: 0 % (ref 0.0–0.2)

## 2019-07-15 LAB — COMPREHENSIVE METABOLIC PANEL
ALT: 53 U/L — ABNORMAL HIGH (ref 0–44)
AST: 25 U/L (ref 15–41)
Albumin: 4 g/dL (ref 3.5–5.0)
Alkaline Phosphatase: 62 U/L (ref 38–126)
Anion gap: 10 (ref 5–15)
BUN: 7 mg/dL (ref 6–20)
CO2: 24 mmol/L (ref 22–32)
Calcium: 9.7 mg/dL (ref 8.9–10.3)
Chloride: 105 mmol/L (ref 98–111)
Creatinine, Ser: 0.87 mg/dL (ref 0.61–1.24)
GFR calc Af Amer: >60 mL/min (ref >60)
GFR calc non Af Amer: >60 mL/min (ref >60)
Glucose, Bld: 129 mg/dL — ABNORMAL HIGH (ref 70–99)
Potassium: 4.2 mmol/L (ref 3.5–5.1)
Sodium: 139 mmol/L (ref 135–145)
Total Bilirubin: 0.9 mg/dL (ref 0.3–1.2)
Total Protein: 7.6 g/dL (ref 6.5–8.1)

## 2019-07-15 LAB — URINALYSIS, ROUTINE W REFLEX MICROSCOPIC
Bilirubin Urine: NEGATIVE
Glucose, UA: NEGATIVE mg/dL
Hgb urine dipstick: NEGATIVE
Ketones, ur: 5 mg/dL — AB
Leukocytes,Ua: NEGATIVE
Nitrite: NEGATIVE
Protein, ur: NEGATIVE mg/dL
Specific Gravity, Urine: 1.018 (ref 1.005–1.030)
pH: 5 (ref 5.0–8.0)

## 2019-07-15 LAB — RESPIRATORY PANEL BY RT PCR (FLU A&B, COVID)
Influenza A by PCR: NEGATIVE
Influenza B by PCR: NEGATIVE
SARS Coronavirus 2 by RT PCR: NEGATIVE

## 2019-07-15 LAB — LACTIC ACID, PLASMA: Lactic Acid, Venous: 1.2 mmol/L (ref 0.5–1.9)

## 2019-07-15 LAB — LIPASE, BLOOD: Lipase: 23 U/L (ref 11–51)

## 2019-07-15 MED ORDER — ACETAMINOPHEN 325 MG PO TABS
650.0000 mg | ORAL_TABLET | Freq: Once | ORAL | Status: AC
  Administered 2019-07-16: 650 mg via ORAL
  Filled 2019-07-15: qty 2

## 2019-07-15 MED ORDER — LEVOFLOXACIN 500 MG PO TABS
500.0000 mg | ORAL_TABLET | Freq: Once | ORAL | Status: DC
  Filled 2019-07-15 (×2): qty 1

## 2019-07-15 MED ORDER — ONDANSETRON HCL 4 MG/2ML IJ SOLN
4.0000 mg | Freq: Once | INTRAMUSCULAR | Status: AC
  Administered 2019-07-15: 15:00:00 4 mg via INTRAVENOUS
  Filled 2019-07-15: qty 2

## 2019-07-15 MED ORDER — IOHEXOL 300 MG/ML  SOLN
100.0000 mL | Freq: Once | INTRAMUSCULAR | Status: AC | PRN
  Administered 2019-07-15: 100 mL via INTRAVENOUS

## 2019-07-15 MED ORDER — OXYCODONE-ACETAMINOPHEN 5-325 MG PO TABS
1.0000 | ORAL_TABLET | Freq: Once | ORAL | Status: AC
  Administered 2019-07-15: 1 via ORAL
  Filled 2019-07-15: qty 1

## 2019-07-15 MED ORDER — PERCOCET 5-325 MG PO TABS: 1.0000 | ORAL_TABLET | ORAL | 0 refills | Status: DC | PRN

## 2019-07-15 MED ORDER — LEVOFLOXACIN 500 MG PO TABS: 500.0000 mg | ORAL_TABLET | Freq: Every day | ORAL | 0 refills | Status: DC

## 2019-07-15 MED ORDER — SODIUM CHLORIDE 0.9 % IV BOLUS
500.0000 mL | Freq: Once | INTRAVENOUS | Status: AC
  Administered 2019-07-15: 500 mL via INTRAVENOUS

## 2019-07-15 MED ORDER — SODIUM CHLORIDE 0.9% FLUSH: 3.0000 mL | Freq: Once | INTRAVENOUS | Status: DC

## 2019-07-15 MED ORDER — HYDROMORPHONE HCL 1 MG/ML IJ SOLN
1.0000 mg | Freq: Once | INTRAMUSCULAR | Status: AC
  Administered 2019-07-15: 15:00:00 1 mg via INTRAVENOUS
  Filled 2019-07-15: qty 1

## 2019-07-15 MED ORDER — IBUPROFEN 400 MG PO TABS: 600.0000 mg | ORAL_TABLET | Freq: Once | ORAL | Status: DC

## 2019-07-15 NOTE — Discharge Instructions (Signed)
In reviewing your CT scan findings they did not see your identify any hernia in the inguinal region or otherwise in your abdomen.  I would allow your surgeon to review this further.  Follow-up with the urologist provided.

## 2019-07-15 NOTE — ED Notes (Signed)
Back from CT

## 2019-07-15 NOTE — ED Provider Notes (Signed)
Patient has orchitis and epididymitis patient be treated for this.  I spoke with Dr. Alyson Ingles with urology about the patient's findings.  He advised to treat the patient with antibiotics and then have him follow-up in his office.   Dalia Heading, PA-C 07/15/19 2155    Maudie Flakes, MD 07/16/19 1459

## 2019-07-15 NOTE — ED Triage Notes (Signed)
Pt reports having inguinal hernia. Had increase in pain on Monday, had recheck by surgeon on Tuesday. Scheduled for surgery next week. Today has increase in pain and it radiates to his testicle and unable to have bowel movement.

## 2019-07-15 NOTE — ED Notes (Signed)
Patient back from Ultrasound.

## 2019-07-15 NOTE — ED Notes (Signed)
1st bottle of constrast given

## 2019-07-15 NOTE — ED Provider Notes (Signed)
[facility] EMERGENCY DEPARTMENT Provider Note   CSN: [id number] Arrival date & time: 07/15/19  1112     History Chief Complaint  Patient presents with  . Hernia    William Garrison is a 44 y.o. male presenting for evaluation of lower abdominal and right testicular pain.  Patient states he has a right inguinal hernia, followed with general surgery.  He is a plan for surgery next week (with Dr Brantley Stage).  On Monday he had increased pain, was seen in the clinic on Tuesday, hernia was found not to be strangulated.  When he woke up this morning, he had acute onset worsening pain.  He reports since this morning, he has been unable to have a bowel movement, this is abnormal for him.  He reports associated nausea, and has had chills.  He denies known fever or vomiting.  No history of abdominal surgeries.  He denies cough, chest pain, shortness of breath, urinary symptoms.  He has taken a total of 600 mg of ibuprofen and 500 mg of Tylenol this morning.  He has not had nothing to eat or drink.  Additional history came for chart review.  Patient with a history of a mild aortic root aneurysm followed annually.  History of hypertension for which he takes medication.  No other medical problems.  HPI     Past Medical History:  Diagnosis Date  . Aortic root aneurysm (HCC)    mild  . Family history of stroke   . Hypertension   . Vitamin B 12 deficiency     Patient Active Problem List   Diagnosis Date Noted  . Dilated aortic root ([place]) 01/14/2018  . Palpitations 01/13/2018  . Family history of early CAD 01/13/2018    Past Surgical History:  Procedure Laterality Date  . KNEE ARTHROSCOPY Left    x2  . WISDOM TOOTH EXTRACTION         Family History  Problem Relation Age of Onset  . Hypercalcemia Mother   . Hypertension Mother   . Aortic aneurysm Father   . Hypertension Father   . Stroke Paternal Grandfather     Social History   Tobacco Use  . Smoking status:  Never Smoker  . Smokeless tobacco: Never Used  Substance Use Topics  . Alcohol use: Yes    Alcohol/week: 7.0 - 14.0 standard drinks    Types: 7 - 14 Glasses of wine per week  . Drug use: No    Home Medications Prior to Admission medications   Medication Sig Start Date End Date Taking? Authorizing Provider  atorvastatin (LIPITOR) 20 MG tablet Take 1 tablet (20 mg total) by mouth daily. 09/09/18   Jettie Booze, MD  Cyanocobalamin (VITAMIN B 12 PO) Take 2,500 mcg by mouth daily.    [provider]  Desloratadine (CLARINEX PO) Take by mouth.    [provider]  Ibuprofen (ADVIL PO) Take by mouth.    [provider]  lisinopril (PRINIVIL,ZESTRIL) 10 MG tablet Take 10 mg by mouth daily.    [provider]    Allergies    Patient has no known allergies.  Review of Systems   Review of Systems  Constitutional: Positive for chills.  Gastrointestinal: Positive for abdominal pain, constipation and nausea.  Genitourinary: Positive for scrotal swelling and testicular pain.  All other systems reviewed and are negative.   Physical Exam Updated Vital Signs BP (!) 144/101 (BP Location: Right Arm)   Pulse (!) 110   Temp (!)  102.2 F (39 C) (Oral)   Resp 16   SpO2 99%   Physical Exam Vitals and nursing note reviewed. Exam conducted with a chaperone present.  Constitutional:      Appearance: He is well-developed.     Comments: Appears very uncomfortable. Warm to the touch.   HENT:     Head: Normocephalic and atraumatic.  Eyes:     Extraocular Movements: Extraocular movements intact.     Conjunctiva/sclera: Conjunctivae normal.     Pupils: Pupils are equal, round, and reactive to light.  Cardiovascular:     Rate and Rhythm: Regular rhythm. Tachycardia present.     Pulses: Normal pulses.     Comments: tachycardic around 110 Pulmonary:     Effort: Pulmonary effort is normal. No respiratory distress.     Breath sounds: Normal breath sounds. No  wheezing.  Abdominal:     General: There is no distension.     Palpations: Abdomen is soft.     Tenderness: There is abdominal tenderness.     Comments: Mild ttp of lower abd. Extremely tender to R inguinal region and testicle. Testicle is red and swollen.   Genitourinary:    Testes:        Right: Tenderness and swelling present.     Comments: Exam limited to severe pain Musculoskeletal:        General: Normal range of motion.     Cervical back: Normal range of motion and neck supple.  Skin:    General: Skin is warm and dry.  Neurological:     Mental Status: He is alert and oriented to person, place, and time.     ED Results / Procedures / Treatments   Labs (all labs ordered are listed, but only abnormal results are displayed) Labs Reviewed  COMPREHENSIVE METABOLIC PANEL - Abnormal; Notable for the following components:      Result Value   Glucose, Bld 129 (*)    ALT 53 (*)    All other components within normal limits  CBC - Abnormal; Notable for the following components:   WBC 13.4 (*)    All other components within normal limits  RESPIRATORY PANEL BY RT PCR (FLU A&B, COVID)  LIPASE, BLOOD  URINALYSIS, ROUTINE W REFLEX MICROSCOPIC  LACTIC ACID, PLASMA  LACTIC ACID, PLASMA    EKG None  Radiology DG Abdomen 1 View  Result Date: 07/15/2019 CLINICAL DATA:  Painful right  groin hernia EXAM: ABDOMEN - 1 VIEW COMPARISON:  None. FINDINGS: Stomach and small bowel are nondistended. Moderate fecal material in the proximal colon, nondilated distally. No evidence of obstruction. No abnormal abdominal calcifications. Regional bones unremarkable. IMPRESSION: Nonobstructive bowel gas pattern Electronically Signed   By: Lucrezia Europe M.D.   On: 07/15/2019 13:06    Procedures Procedures (including critical care time)  Medications Ordered in ED Medications  sodium chloride flush (NS) 0.9 % injection 3 mL (has no administration in time range)  HYDROmorphone (DILAUDID) injection 1 mg  (has no administration in time range)  ondansetron (ZOFRAN) injection 4 mg (has no administration in time range)  sodium chloride 0.9 % bolus 500 mL (has no administration in time range)    ED Course  I have reviewed the triage vital signs and the nursing notes.  Pertinent labs & imaging results that were available during my care of the patient were reviewed by me and considered in my medical decision making (see chart for details).    MDM Rules/Calculators/A&P  Patient presenting for evaluation of right inguinal and testicular pain.  On exam, patient appears extremely comfortable.  Warm to touch, concern that he has developed a fever.  He is mildly tachycardic, likely due to pain.  Concern for incarcerated for strangulated hernia.  Also consider testicular torsion versus infection.  Will recheck vital signs, as I saw pt 3 hrs after arrival to the ED.  Labs obtained from triage shows slight leukocytosis at 13.5.  Will order lactic to look for ischemia.  Will consult general surgery due to possible incarcerated hernia.  Discussed with attending, Dr. Wilson Singer evaluated the patient.  Discussed with Evette Cristal, PA-C from general surgery.  Recommends CT abdomen pelvis with and without contrast for evaluation.  They will evaluate the patient after results have returned.  Will obtain ultrasound to rule out torsion while imaging is pending.  Pt signed out to C Laywer, PA-C for f/u on imaging and CCS recommendations.   Final Clinical Impression(s) / ED Diagnoses Final diagnoses:  None    Rx / DC Orders ED Discharge Orders    None       Franchot Heidelberg, PA-C 07/15/19 1511    [name], MD 07/16/19 1018

## 2019-07-15 NOTE — ED Provider Notes (Signed)
Medical screening examination/treatment/procedure(s) were conducted as a shared visit with non-physician practitioner(s) and myself.  I personally evaluated the patient during the encounter.    43yM with acute R testicular pain. Known inguinal hernia and actually scheduled for repair in a week. I question if this is actually a testicular problem. Exquisitely tender R testicle to the point that I really can't get a great exam. I do not appreciate significant fullness in inguinal region. Discussed with surgery. Will obtain US of the scrotum to evaluate for possible testicular torsion. Also consider orchitis/epididymitis. Pain meds. Labs, UA.    [name], MD 07/15/19 1504

## 2019-07-19 ENCOUNTER — Other Ambulatory Visit (HOSPITAL_COMMUNITY): Payer: BLUE CROSS/BLUE SHIELD

## 2019-07-22 ENCOUNTER — Ambulatory Visit (HOSPITAL_BASED_OUTPATIENT_CLINIC_OR_DEPARTMENT_OTHER): Admit: 2019-07-22 | Payer: 59 | Admitting: Surgery

## 2019-07-22 HISTORY — DX: Congenital aneurysm of aorta: Q25.43

## 2019-07-22 HISTORY — DX: Aneurysm of the ascending aorta, without rupture: I71.21

## 2019-07-22 HISTORY — DX: Aortic aneurysm of unspecified site, without rupture: I71.9

## 2019-07-22 SURGERY — REPAIR, HERNIA, INGUINAL, ADULT
Anesthesia: General | Laterality: Right

## 2019-08-02 ENCOUNTER — Ambulatory Visit: Payer: Self-pay | Admitting: Surgery

## 2019-08-02 NOTE — H&P (Signed)
William Garrison Documented: 08/02/2019 3:19 PM Location: [facility] Patient #: [name] DOB: 08/30/1975 Married / Language: [name] / Race: White Male  History of Present Illness [name] A. Leshonda Galambos MD; 08/02/2019 4:02 PM) Patient words: Patient returns for follow-up today. He developed epidydimitis right testicle was seen by urology and is much better. He was scheduled for repair of right inguinal hernia and a computed tomography scan was performed as part of his workup which showed small bilateral inguinal hernias. I discussed with him the benefits of the laparoscopic approach in this circumstance would've reviewed both open versus laparoscopic repairs mesh and the pros and cons of each. His pain is resolved today.  The patient is a 44 year old male.   Allergies (Tanisha A. Owens Shark, [place]; 08/02/2019 3:19 PM) No Known Drug Allergies [06/18/2019]: Allergies Reconciled  Medication History (Tanisha A. Owens Shark, [place]; 08/02/2019 3:19 PM) Atorvastatin Calcium (20MG  Tablet, Oral) Active. Lisinopril (10MG  Tablet, Oral) Active. Medications Reconciled    Vitals (Tanisha A. Brown RMA; 08/02/2019 3:19 PM) 08/02/2019 3:19 PM Weight: 196.6 lb Height: 71in Body Surface Area: 2.09 m Body Mass Index: 27.42 kg/m  Temp.: 97.37F  Pulse: 101 (Regular)  BP: 128/82 (Sitting, Left Arm, Standard)        Physical Exam (Jamile Sivils A. Hildur Bayer MD; 08/02/2019 4:03 PM)  General Mental Status-Alert. General Appearance-Consistent with stated age. Hydration-Well hydrated. Voice-Normal.  Abdomen Note: Not repeated today  Male Genitourinary Note: Nontender testicles bilaterally  Neurologic Neurologic evaluation reveals -alert and oriented x 3 with no impairment of recent or remote memory. Mental Status-Normal.  Musculoskeletal Normal Exam - Left-Upper Extremity Strength Normal and Lower Extremity Strength Normal. Normal Exam - Right-Upper Extremity Strength  Normal, Lower Extremity Weakness.    Assessment & Plan (Aquan Kope A. Morghan Kester MD; 08/02/2019 4:04 PM)  BILATERAL INGUINAL HERNIA WITHOUT OBSTRUCTION OR GANGRENE, RECURRENCE NOT SPECIFIED (K40.20) Impression: Discussed laparoscopic versus open repair and the pros and cons of each as well as potential complications of each. He is opted for microscopic bilateral inguinal hernia repair with mesh. The risk of hernia repair include bleeding, infection, organ injury, bowel injury, bladder injury, nerve injury recurrent hernia, blood clots, worsening of underlying condition, chronic pain, mesh use, open surgery, death, and the need for other operations. Pt agrees to proceed  Current Plans You are being scheduled for surgery- Our schedulers will call you.  You should hear from our office's scheduling department within 5 working days about the location, date, and time of surgery. We try to make accommodations for patient's preferences in scheduling surgery, but sometimes the OR schedule or the surgeon's schedule prevents [name] from making those accommodations.  If you have not heard from our office [phone]) in 5 working days, call the office and ask for your surgeon's nurse.  If you have other questions about your diagnosis, plan, or surgery, call the office and ask for your surgeon's nurse.  Pt Education - Pamphlet Given - Hernia Surgery: discussed with patient and provided information. The anatomy & physiology of the abdominal wall and pelvic floor was discussed. The pathophysiology of hernias in the inguinal and pelvic region was discussed. Natural history risks such as progressive enlargement, pain, incarceration, and strangulation was discussed. Contributors to complications such as smoking, obesity, diabetes, prior surgery, etc were discussed.  I feel the risks of no intervention will lead to serious problems that outweigh the operative risks; therefore, I recommended surgery to reduce and  repair the hernia. I explained laparoscopic techniques with possible need for  an open approach. I noted usual use of mesh to patch and/or buttress hernia repair  Risks such as bleeding, infection, abscess, need for further treatment, heart attack, death, and other risks were discussed. I noted a good likelihood this will help address the problem. Goals of post-operative recovery were discussed as well. Possibility that this will not correct all symptoms was explained. I stressed the importance of low-impact activity, aggressive pain control, avoiding constipation, & not pushing through pain to minimize risk of post-operative chronic pain or injury. Possibility of reherniation was discussed. We will work to minimize complications.  An educational handout further explaining the pathology & treatment options was given as well. Questions were answered. The patient expresses understanding & wishes to proceed with surgery.  Pt Education - CCS Mesh education: discussed with patient and provided information.

## 2019-08-02 NOTE — H&P (View-Only) (Signed)
William Garrison Documented: 08/02/2019 3:19 PM Location: [facility] Patient #: [name] DOB: Dec 08, 1975 Married / Language: [name] / Race: White Male  History of Present Illness [name] MD; 08/02/2019 4:02 PM) Patient words: Patient returns for follow-up today. He developed epidydimitis right testicle was seen by urology and is much better. He was scheduled for repair of right inguinal hernia and a computed tomography scan was performed as part of his workup which showed small bilateral inguinal hernias. I discussed with him the benefits of the laparoscopic approach in this circumstance would've reviewed both open versus laparoscopic repairs mesh and the pros and cons of each. His pain is resolved today.  The patient is a 44 year old male.   Allergies (Tanisha A. Owens Shark, [place]; 08/02/2019 3:19 PM) No Known Drug Allergies [06/18/2019]: Allergies Reconciled  Medication History (Tanisha A. Owens Shark, [place]; 08/02/2019 3:19 PM) Atorvastatin Calcium (20MG  Tablet, Oral) Active. Lisinopril (10MG  Tablet, Oral) Active. Medications Reconciled    Vitals (Tanisha A. Brown RMA; 08/02/2019 3:19 PM) 08/02/2019 3:19 PM Weight: 196.6 lb Height: 71in Body Surface Area: 2.09 m Body Mass Index: 27.42 kg/m  Temp.: 97.24F  Pulse: 101 (Regular)  BP: 128/82 (Sitting, Left Arm, Standard)        Physical Exam (Lauramae Kneisley A. Cinzia Devos MD; 08/02/2019 4:03 PM)  General Mental Status-Alert. General Appearance-Consistent with stated age. Hydration-Well hydrated. Voice-Normal.  Abdomen Note: Not repeated today  Male Genitourinary Note: Nontender testicles bilaterally  Neurologic Neurologic evaluation reveals -alert and oriented x 3 with no impairment of recent or remote memory. Mental Status-Normal.  Musculoskeletal Normal Exam - Left-Upper Extremity Strength Normal and Lower Extremity Strength Normal. Normal Exam - Right-Upper Extremity Strength  Normal, Lower Extremity Weakness.    Assessment & Plan (Jabori Henegar A. Omaira Mellen MD; 08/02/2019 4:04 PM)  BILATERAL INGUINAL HERNIA WITHOUT OBSTRUCTION OR GANGRENE, RECURRENCE NOT SPECIFIED (K40.20) Impression: Discussed laparoscopic versus open repair and the pros and cons of each as well as potential complications of each. He is opted for microscopic bilateral inguinal hernia repair with mesh. The risk of hernia repair include bleeding, infection, organ injury, bowel injury, bladder injury, nerve injury recurrent hernia, blood clots, worsening of underlying condition, chronic pain, mesh use, open surgery, death, and the need for other operations. Pt agrees to proceed  Current Plans You are being scheduled for surgery- Our schedulers will call you.  You should hear from our office's scheduling department within 5 working days about the location, date, and time of surgery. We try to make accommodations for patient's preferences in scheduling surgery, but sometimes the OR schedule or the surgeon's schedule prevents [name] from making those accommodations.  If you have not heard from our office [phone]) in 5 working days, call the office and ask for your surgeon's nurse.  If you have other questions about your diagnosis, plan, or surgery, call the office and ask for your surgeon's nurse.  Pt Education - Pamphlet Given - Hernia Surgery: discussed with patient and provided information. The anatomy & physiology of the abdominal wall and pelvic floor was discussed. The pathophysiology of hernias in the inguinal and pelvic region was discussed. Natural history risks such as progressive enlargement, pain, incarceration, and strangulation was discussed. Contributors to complications such as smoking, obesity, diabetes, prior surgery, etc were discussed.  I feel the risks of no intervention will lead to serious problems that outweigh the operative risks; therefore, I recommended surgery to reduce and  repair the hernia. I explained laparoscopic techniques with possible need for  an open approach. I noted usual use of mesh to patch and/or buttress hernia repair  Risks such as bleeding, infection, abscess, need for further treatment, heart attack, death, and other risks were discussed. I noted a good likelihood this will help address the problem. Goals of post-operative recovery were discussed as well. Possibility that this will not correct all symptoms was explained. I stressed the importance of low-impact activity, aggressive pain control, avoiding constipation, & not pushing through pain to minimize risk of post-operative chronic pain or injury. Possibility of reherniation was discussed. We will work to minimize complications.  An educational handout further explaining the pathology & treatment options was given as well. Questions were answered. The patient expresses understanding & wishes to proceed with surgery.  Pt Education - CCS Mesh education: discussed with patient and provided information.

## 2019-08-24 IMAGING — CT CT HEART MORP W/ CTA COR W/ SCORE W/ CA W/CM &/OR W/O CM
4 of 7 series · 8 of 20 positions shown, 9 images · IV contrast (APPLIED)
Comparison: None.
COMPARISON: None.
COMPARISON: None.

Addendum:
EXAM:
OVER-READ INTERPRETATION  CT CHEST

The following report is an over-read performed by radiologist Dr.
Yel Al Garni [REDACTED] on 09/08/2018. This over-read
does not include interpretation of cardiac or coronary anatomy or
pathology. The coronary CTA interpretation by the cardiologist is
attached.
CLINICAL DATA: Chest pain
Cardiac CTA
MEDICATIONS:
Sub lingual nitro. 4mg x 2
TECHNIQUE: The patient was scanned on a Siemens [REDACTED]ice scanner. Gantry
rotation speed was 250 msecs. Collimation was 0.6 mm. A 100 kV
prospective scan was triggered in the ascending thoracic aorta at
35-75% of the R-R interval. Average HR during the scan was 60 bpm.
The 3D data set was interpreted on a dedicated work station using
MPR, MIP and VRT modes. A total of 80cc of contrast was used.

[Series 6: best diast 75 % · axial · 0.39mm/px · z∈[+1338,+1383]mm · 2 of 343 slices shown, 3 images]
[im 115/343  vessel]
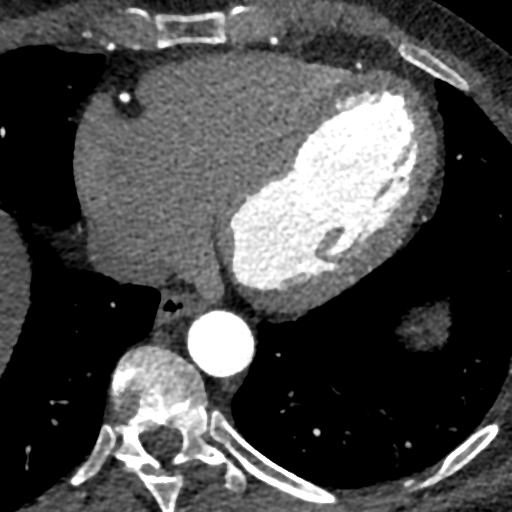
[im 115/343  lung]
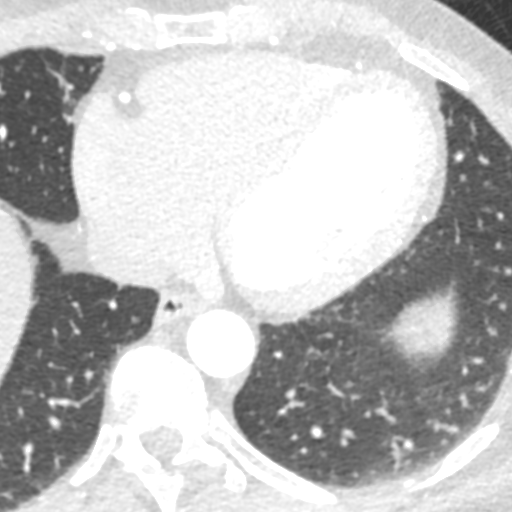
[im 229/343  vessel]
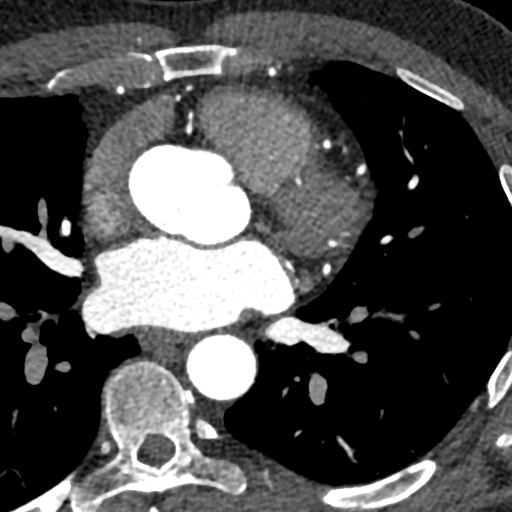

[Series 7: best syst 36 % · axial · 0.39mm/px · z∈[+1338,+1383]mm · 2 of 343 slices shown]
[im 115/343  vessel]
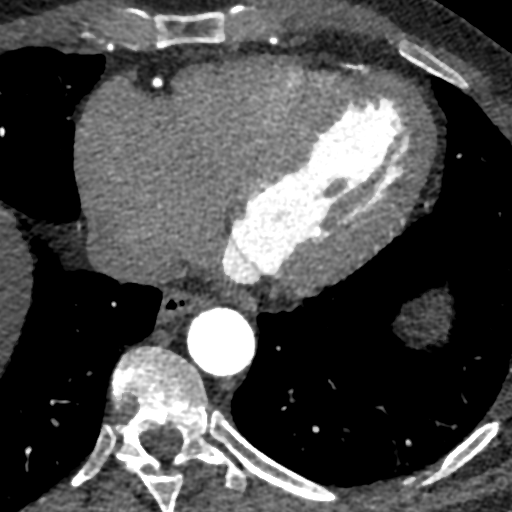
[im 229/343  vessel]
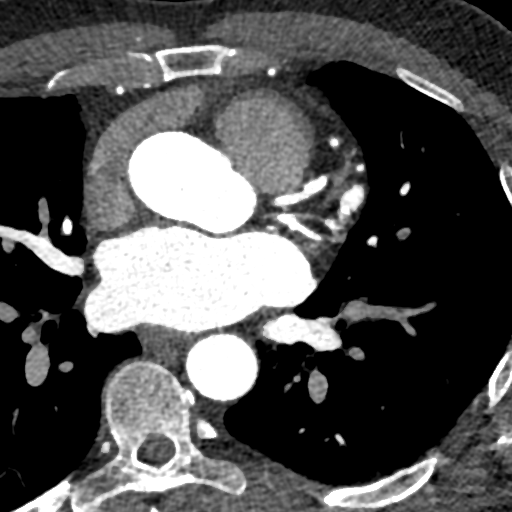

[Series 8: ts diast sharp 75 % · axial · 0.39mm/px · z∈[+1338,+1383]mm · 2 of 343 slices shown]
[im 115/343  lung]
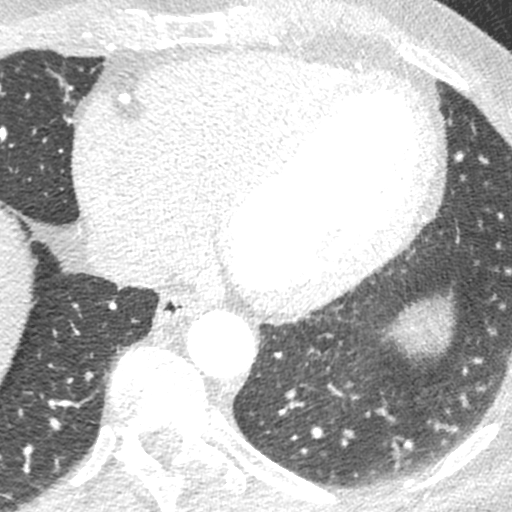
[im 229/343  lung]
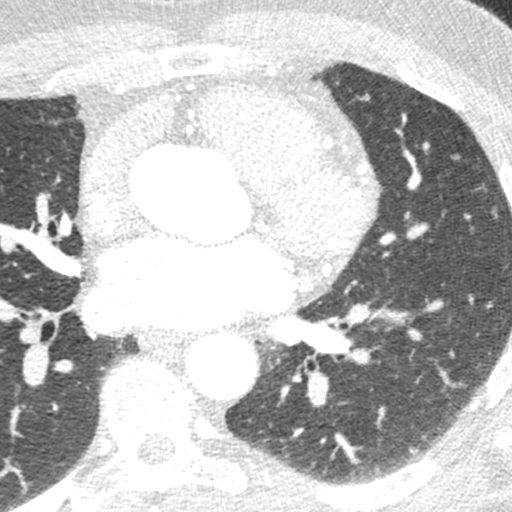

[Series 9: ts syst sharp 36 % · axial · 0.39mm/px · z∈[+1338,+1383]mm · 2 of 343 slices shown]
[im 115/343  lung]
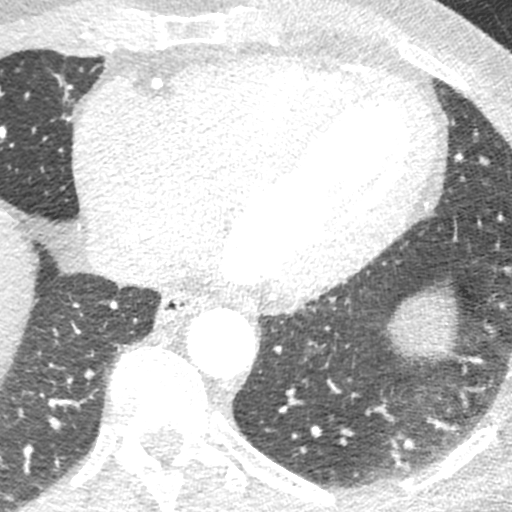
[im 229/343  lung]
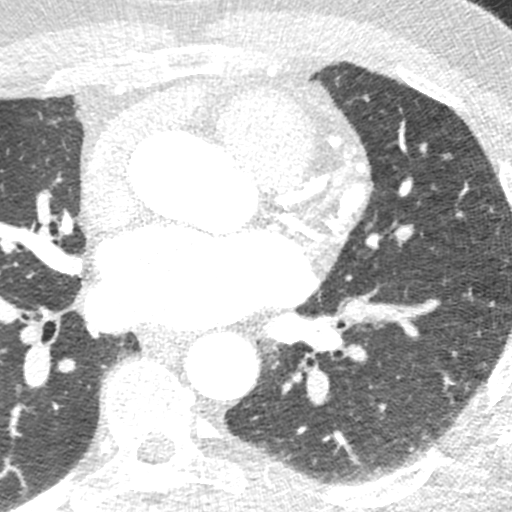

[8 of 20 positions shown; findings below may reference images not displayed]

FINDINGS: Vascular: Mild aneurysmal dilatation of the aortic root measuring
4.3 cm at the sinuses of Valsalva. Heart is normal size.

Mediastinum/Nodes: No adenopathy in the lower mediastinum or hila.

Lungs/Pleura: No confluent opacities or effusions.

Upper Abdomen: Imaging into the upper abdomen shows no acute
findings.

Musculoskeletal: Chest wall soft tissues are unremarkable. No acute
bony abnormality.
IMPRESSION: Aortic root aneurysm measuring up to 4.3 cm. Recommend annual
imaging followup by CTA or MRA. This recommendation follows 0363
ACCF/AHA/AATS/ACR/ASA/SCA/FERNANDA MENDES/KITE/SLADE/MORRE Guidelines for the
Diagnosis and Management of Patients with Thoracic Aortic Disease.
Circulation. 0363; 121: E266-e369. Aortic aneurysm NOS (JHHKB-G0E.G)
FINDINGS: Non-cardiac: See separate report from [REDACTED].

Pulmonary veins drain normally to the left atrium. Enlarged aortic
root, see radiology over-read for details.

Calcium Score: 46 Agatston units.

Coronary Arteries: Right dominant with no anomalies

LM: No plaque or stenosis.

LAD system: Mixed plaque proximal LAD with minimal stenosis.

Circumflex system: Large OM1 and OM2 with small AV LCx beyond OM2.
No plaque or stenosis.

RCA system: No plaque or stenosis.
IMPRESSION: 1. Coronary artery calcium score 46 Agatston units. This places the
patient in the 91st percentile for age and gender, suggesting high
risk for future cardiac events.

2.  Nonobstructive coronary disease in the proximal LAD.

Brendan Porras

*** End of Addendum ***
Addendum:
EXAM:
OVER-READ INTERPRETATION  CT CHEST

The following report is an over-read performed by radiologist Dr.
Yel Al Garni [REDACTED] on 09/08/2018. This over-read
does not include interpretation of cardiac or coronary anatomy or
pathology. The coronary CTA interpretation by the cardiologist is
attached.
FINDINGS: Vascular: Mild aneurysmal dilatation of the aortic root measuring
4.3 cm at the sinuses of Valsalva. Heart is normal size.

Mediastinum/Nodes: No adenopathy in the lower mediastinum or hila.

Lungs/Pleura: No confluent opacities or effusions.

Upper Abdomen: Imaging into the upper abdomen shows no acute
findings.

Musculoskeletal: Chest wall soft tissues are unremarkable. No acute
bony abnormality.
IMPRESSION: Aortic root aneurysm measuring up to 4.3 cm. Recommend annual
imaging followup by CTA or MRA. This recommendation follows 0363
ACCF/AHA/AATS/ACR/ASA/SCA/FERNANDA MENDES/KITE/SLADE/MORRE Guidelines for the
Diagnosis and Management of Patients with Thoracic Aortic Disease.
Circulation. 0363; 121: E266-e369. Aortic aneurysm NOS (JHHKB-G0E.G)
FINDINGS: Non-cardiac: See separate report from [REDACTED].

Pulmonary veins drain normally to the left atrium. Enlarged aortic
root, see radiology over-read for details.

Calcium Score: 46 Agatston units.

Coronary Arteries: Right dominant with no anomalies

LM: No plaque or stenosis.

LAD system: Mixed plaque proximal LAD with minimal stenosis.

Circumflex system: Large OM1 and OM2 with small AV LCx beyond OM2.
No plaque or stenosis.

RCA system: No plaque or stenosis.
IMPRESSION: 1. Coronary artery calcium score 46 Agatston units. This places the
patient in the 91st percentile for age and gender, suggesting high
risk for future cardiac events.

2.  Nonobstructive coronary disease in the proximal LAD.

Brendan Porras

*** End of Addendum ***
EXAM:
OVER-READ INTERPRETATION  CT CHEST

The following report is an over-read performed by radiologist Dr.
Yel Al Garni [REDACTED] on 09/08/2018. This over-read
does not include interpretation of cardiac or coronary anatomy or
pathology. The coronary CTA interpretation by the cardiologist is
attached.
FINDINGS: Vascular: Mild aneurysmal dilatation of the aortic root measuring
4.3 cm at the sinuses of Valsalva. Heart is normal size.

Mediastinum/Nodes: No adenopathy in the lower mediastinum or hila.

Lungs/Pleura: No confluent opacities or effusions.

Upper Abdomen: Imaging into the upper abdomen shows no acute
findings.

Musculoskeletal: Chest wall soft tissues are unremarkable. No acute
bony abnormality.
IMPRESSION: Aortic root aneurysm measuring up to 4.3 cm. Recommend annual
imaging followup by CTA or MRA. This recommendation follows 0363
ACCF/AHA/AATS/ACR/ASA/SCA/FERNANDA MENDES/KITE/SLADE/MORRE Guidelines for the
Diagnosis and Management of Patients with Thoracic Aortic Disease.
Circulation. 0363; 121: E266-e369. Aortic aneurysm NOS (JHHKB-G0E.G)

## 2019-08-25 ENCOUNTER — Other Ambulatory Visit: Payer: Self-pay

## 2019-08-25 ENCOUNTER — Encounter (HOSPITAL_BASED_OUTPATIENT_CLINIC_OR_DEPARTMENT_OTHER): Payer: Self-pay | Admitting: Surgery

## 2019-08-28 ENCOUNTER — Other Ambulatory Visit (HOSPITAL_COMMUNITY)
Admission: RE | Admit: 2019-08-28 | Discharge: 2019-08-28 | Disposition: A | Discharge status: Home / Self Care | Payer: 59 | Source: Ambulatory Visit | Attending: Surgery | Admitting: Surgery

## 2019-08-28 DIAGNOSIS — Z20822 Contact with and (suspected) exposure to covid-19: Secondary | ICD-10-CM | POA: Diagnosis not present

## 2019-08-28 DIAGNOSIS — Z01812 Encounter for preprocedural laboratory examination: Secondary | ICD-10-CM | POA: Diagnosis not present

## 2019-08-28 LAB — SARS CORONAVIRUS 2 (TAT 6-24 HRS): SARS Coronavirus 2: NEGATIVE

## 2019-08-30 ENCOUNTER — Encounter (HOSPITAL_BASED_OUTPATIENT_CLINIC_OR_DEPARTMENT_OTHER)
Admission: RE | Admit: 2019-08-30 | Discharge: 2019-08-30 | Disposition: A | Discharge status: Home / Self Care | Payer: 59 | Source: Ambulatory Visit | Attending: Surgery | Admitting: Surgery

## 2019-08-30 ENCOUNTER — Other Ambulatory Visit: Payer: Self-pay

## 2019-08-30 DIAGNOSIS — Z01812 Encounter for preprocedural laboratory examination: Secondary | ICD-10-CM | POA: Diagnosis not present

## 2019-08-30 DIAGNOSIS — I498 Other specified cardiac arrhythmias: Secondary | ICD-10-CM | POA: Insufficient documentation

## 2019-08-30 DIAGNOSIS — I10 Essential (primary) hypertension: Secondary | ICD-10-CM | POA: Insufficient documentation

## 2019-08-30 DIAGNOSIS — Q2543 Congenital aneurysm of aorta: Secondary | ICD-10-CM | POA: Diagnosis not present

## 2019-08-30 DIAGNOSIS — E785 Hyperlipidemia, unspecified: Secondary | ICD-10-CM | POA: Diagnosis not present

## 2019-08-30 DIAGNOSIS — D176 Benign lipomatous neoplasm of spermatic cord: Secondary | ICD-10-CM | POA: Diagnosis not present

## 2019-08-30 DIAGNOSIS — Z01810 Encounter for preprocedural cardiovascular examination: Secondary | ICD-10-CM | POA: Insufficient documentation

## 2019-08-30 DIAGNOSIS — Z79899 Other long term (current) drug therapy: Secondary | ICD-10-CM | POA: Diagnosis not present

## 2019-08-30 DIAGNOSIS — K4020 Bilateral inguinal hernia, without obstruction or gangrene, not specified as recurrent: Secondary | ICD-10-CM | POA: Diagnosis not present

## 2019-08-30 LAB — CBC WITH DIFFERENTIAL/PLATELET
Abs Immature Granulocytes: 0 10*3/uL (ref 0.00–0.07)
Basophils Absolute: 0 10*3/uL (ref 0.0–0.1)
Basophils Relative: 0 %
Eosinophils Absolute: 0 10*3/uL (ref 0.0–0.5)
Eosinophils Relative: 1 %
HCT: 44.3 % (ref 39.0–52.0)
Hemoglobin: 15.2 g/dL (ref 13.0–17.0)
Immature Granulocytes: 0 %
Lymphocytes Relative: 32 %
Lymphs Abs: 1 10*3/uL (ref 0.7–4.0)
MCH: 32.5 pg (ref 26.0–34.0)
MCHC: 34.3 g/dL (ref 30.0–36.0)
MCV: 94.9 fL (ref 80.0–100.0)
Monocytes Absolute: 0.4 10*3/uL (ref 0.1–1.0)
Monocytes Relative: 13 %
Neutro Abs: 1.7 10*3/uL (ref 1.7–7.7)
Neutrophils Relative %: 54 %
Platelets: 194 10*3/uL (ref 150–400)
RBC: 4.67 MIL/uL (ref 4.22–5.81)
RDW: 13 % (ref 11.5–15.5)
WBC: 3.2 10*3/uL — ABNORMAL LOW (ref 4.0–10.5)
nRBC: 0 % (ref 0.0–0.2)

## 2019-08-30 LAB — COMPREHENSIVE METABOLIC PANEL
ALT: 97 U/L — ABNORMAL HIGH (ref 0–44)
AST: 74 U/L — ABNORMAL HIGH (ref 15–41)
Albumin: 4.2 g/dL (ref 3.5–5.0)
Alkaline Phosphatase: 59 U/L (ref 38–126)
Anion gap: 12 (ref 5–15)
BUN: 7 mg/dL (ref 6–20)
CO2: 26 mmol/L (ref 22–32)
Calcium: 9.9 mg/dL (ref 8.9–10.3)
Chloride: 100 mmol/L (ref 98–111)
Creatinine, Ser: 0.98 mg/dL (ref 0.61–1.24)
GFR calc Af Amer: >60 mL/min (ref >60)
GFR calc non Af Amer: >60 mL/min (ref >60)
Glucose, Bld: 108 mg/dL — ABNORMAL HIGH (ref 70–99)
Potassium: 4.4 mmol/L (ref 3.5–5.1)
Sodium: 138 mmol/L (ref 135–145)
Total Bilirubin: 1.2 mg/dL (ref 0.3–1.2)
Total Protein: 7.1 g/dL (ref 6.5–8.1)

## 2019-08-30 NOTE — Progress Notes (Signed)

## 2019-08-30 NOTE — Progress Notes (Signed)
EKG and chart reviewed by Dr. Deatra Canter and will proceed with surgery as scheduled.

## 2019-08-31 NOTE — Anesthesia Preprocedure Evaluation (Addendum)
Anesthesia Evaluation  Patient identified by MRN, date of birth, ID band Patient awake    Reviewed: Allergy & Precautions, NPO status , Patient's Chart, lab work & pertinent test results  Airway Mallampati: III  TM Distance: >3 FB Neck ROM: Full    Dental no notable dental hx.    Pulmonary neg pulmonary ROS,    Pulmonary exam normal breath sounds clear to auscultation       Cardiovascular hypertension, Pt. on medications Normal cardiovascular exam Rhythm:Regular Rate:Normal  ECG: NSR, rate 70  Aortic root aneurysm   Neuro/Psych negative neurological ROS  negative psych ROS   GI/Hepatic negative GI ROS, Neg liver ROS,   Endo/Other  negative endocrine ROS  Renal/GU negative Renal ROS     Musculoskeletal negative musculoskeletal ROS (+)   Abdominal   Peds  Hematology HLD   Anesthesia Other Findings BILATERAL INGUINAL HERNIA  Reproductive/Obstetrics                            Anesthesia Physical Anesthesia Plan  ASA: II  Anesthesia Plan: General and Regional   Post-op Pain Management: GA combined w/ Regional for post-op pain   Induction: Intravenous  PONV Risk Score and Plan: 3 and Midazolam, Dexamethasone, Ondansetron and Treatment may vary due to age or medical condition  Airway Management Planned: Oral ETT  Additional Equipment:   Intra-op Plan:   Post-operative Plan: Extubation in OR  Informed Consent: I have reviewed the patients History and Physical, chart, labs and discussed the procedure including the risks, benefits and alternatives for the proposed anesthesia with the patient or authorized representative who has indicated his/her understanding and acceptance.     Dental advisory given  Plan Discussed with: CRNA  Anesthesia Plan Comments:       Anesthesia Quick Evaluation

## 2019-09-01 ENCOUNTER — Encounter (HOSPITAL_BASED_OUTPATIENT_CLINIC_OR_DEPARTMENT_OTHER): Payer: Self-pay | Admitting: Surgery

## 2019-09-01 ENCOUNTER — Other Ambulatory Visit: Payer: Self-pay | Admitting: Interventional Cardiology

## 2019-09-01 ENCOUNTER — Other Ambulatory Visit: Payer: Self-pay

## 2019-09-01 ENCOUNTER — Ambulatory Visit (HOSPITAL_BASED_OUTPATIENT_CLINIC_OR_DEPARTMENT_OTHER): Payer: No Typology Code available for payment source | Admitting: Anesthesiology

## 2019-09-01 ENCOUNTER — Ambulatory Visit (HOSPITAL_BASED_OUTPATIENT_CLINIC_OR_DEPARTMENT_OTHER)
Admission: RE | Admit: 2019-09-01 | Discharge: 2019-09-01 | Disposition: A | Discharge status: Home / Self Care | Payer: No Typology Code available for payment source | Attending: Surgery | Admitting: Surgery

## 2019-09-01 ENCOUNTER — Encounter (HOSPITAL_BASED_OUTPATIENT_CLINIC_OR_DEPARTMENT_OTHER)
Admission: RE | Disposition: A | Discharge status: Home / Self Care | Payer: Self-pay | Source: Home / Self Care | Attending: Surgery

## 2019-09-01 DIAGNOSIS — E785 Hyperlipidemia, unspecified: Secondary | ICD-10-CM | POA: Insufficient documentation

## 2019-09-01 DIAGNOSIS — Z79899 Other long term (current) drug therapy: Secondary | ICD-10-CM | POA: Insufficient documentation

## 2019-09-01 DIAGNOSIS — K4020 Bilateral inguinal hernia, without obstruction or gangrene, not specified as recurrent: Secondary | ICD-10-CM | POA: Diagnosis not present

## 2019-09-01 DIAGNOSIS — I10 Essential (primary) hypertension: Secondary | ICD-10-CM | POA: Insufficient documentation

## 2019-09-01 DIAGNOSIS — Q2543 Congenital aneurysm of aorta: Secondary | ICD-10-CM | POA: Insufficient documentation

## 2019-09-01 DIAGNOSIS — D176 Benign lipomatous neoplasm of spermatic cord: Secondary | ICD-10-CM | POA: Insufficient documentation

## 2019-09-01 HISTORY — PX: INGUINAL HERNIA REPAIR: SHX194

## 2019-09-01 SURGERY — REPAIR, HERNIA, INGUINAL, BILATERAL, LAPAROSCOPIC
Anesthesia: General | Site: Abdomen | Laterality: Bilateral

## 2019-09-01 MED ORDER — LACTATED RINGERS IV SOLN: INTRAVENOUS | Status: DC

## 2019-09-01 MED ORDER — MIDAZOLAM HCL 2 MG/2ML IJ SOLN
INTRAMUSCULAR | Status: AC
  Filled 2019-09-01: qty 2

## 2019-09-01 MED ORDER — PHENYLEPHRINE 40 MCG/ML (10ML) SYRINGE FOR IV PUSH (FOR BLOOD PRESSURE SUPPORT)
PREFILLED_SYRINGE | INTRAVENOUS | Status: DC | PRN
  Administered 2019-09-01 (×2): 120 ug via INTRAVENOUS

## 2019-09-01 MED ORDER — FENTANYL CITRATE (PF) 100 MCG/2ML IJ SOLN
INTRAMUSCULAR | Status: AC
  Filled 2019-09-01: qty 2

## 2019-09-01 MED ORDER — OXYCODONE HCL 5 MG PO TABS: 5.0000 mg | ORAL_TABLET | Freq: Four times a day (QID) | ORAL | 0 refills | Status: DC | PRN

## 2019-09-01 MED ORDER — ONDANSETRON HCL 4 MG/2ML IJ SOLN
INTRAMUSCULAR | Status: DC | PRN
  Administered 2019-09-01: 4 mg via INTRAVENOUS

## 2019-09-01 MED ORDER — LIDOCAINE 2% (20 MG/ML) 5 ML SYRINGE
INTRAMUSCULAR | Status: AC
  Filled 2019-09-01: qty 5

## 2019-09-01 MED ORDER — CELECOXIB 200 MG PO CAPS
200.0000 mg | ORAL_CAPSULE | ORAL | Status: AC
  Administered 2019-09-01: 07:00:00 200 mg via ORAL

## 2019-09-01 MED ORDER — OXYCODONE HCL 5 MG/5ML PO SOLN: 5.0000 mg | Freq: Once | ORAL | Status: AC | PRN

## 2019-09-01 MED ORDER — DEXAMETHASONE SODIUM PHOSPHATE 10 MG/ML IJ SOLN
INTRAMUSCULAR | Status: AC
  Filled 2019-09-01: qty 1

## 2019-09-01 MED ORDER — ROCURONIUM BROMIDE 10 MG/ML (PF) SYRINGE
PREFILLED_SYRINGE | INTRAVENOUS | Status: DC | PRN
  Administered 2019-09-01: 30 mg via INTRAVENOUS
  Administered 2019-09-01: 50 mg via INTRAVENOUS
  Administered 2019-09-01: 30 mg via INTRAVENOUS
  Administered 2019-09-01: 20 mg via INTRAVENOUS

## 2019-09-01 MED ORDER — FENTANYL CITRATE (PF) 100 MCG/2ML IJ SOLN
50.0000 ug | INTRAMUSCULAR | Status: AC | PRN
  Administered 2019-09-01: 100 ug via INTRAVENOUS
  Administered 2019-09-01 (×2): 50 ug via INTRAVENOUS
  Administered 2019-09-01: 08:00:00 100 ug via INTRAVENOUS

## 2019-09-01 MED ORDER — CLONIDINE HCL (ANALGESIA) 100 MCG/ML EP SOLN
EPIDURAL | Status: DC | PRN
  Administered 2019-09-01: 200 ug

## 2019-09-01 MED ORDER — GABAPENTIN 300 MG PO CAPS
300.0000 mg | ORAL_CAPSULE | ORAL | Status: AC
  Administered 2019-09-01: 07:00:00 300 mg via ORAL

## 2019-09-01 MED ORDER — SODIUM CHLORIDE 0.9 % IR SOLN
Status: DC | PRN
  Administered 2019-09-01: 100 mL via INTRAVESICAL
  Administered 2019-09-01: 1

## 2019-09-01 MED ORDER — HYDROMORPHONE HCL 1 MG/ML IJ SOLN
INTRAMUSCULAR | Status: AC
  Filled 2019-09-01: qty 0.5

## 2019-09-01 MED ORDER — ONDANSETRON HCL 4 MG/2ML IJ SOLN
INTRAMUSCULAR | Status: AC
  Filled 2019-09-01: qty 2

## 2019-09-01 MED ORDER — CEFAZOLIN SODIUM-DEXTROSE 2-4 GM/100ML-% IV SOLN
INTRAVENOUS | Status: AC
  Filled 2019-09-01: qty 100

## 2019-09-01 MED ORDER — CHLORHEXIDINE GLUCONATE CLOTH 2 % EX PADS: 6.0000 | MEDICATED_PAD | Freq: Once | CUTANEOUS | Status: DC

## 2019-09-01 MED ORDER — BUPIVACAINE HCL (PF) 0.25 % IJ SOLN
INTRAMUSCULAR | Status: DC | PRN
  Administered 2019-09-01: 10 mL

## 2019-09-01 MED ORDER — BUPIVACAINE HCL (PF) 0.25 % IJ SOLN
INTRAMUSCULAR | Status: AC
  Filled 2019-09-01: qty 30

## 2019-09-01 MED ORDER — ROPIVACAINE HCL 5 MG/ML IJ SOLN
INTRAMUSCULAR | Status: DC | PRN
  Administered 2019-09-01: 40 mL via PERINEURAL

## 2019-09-01 MED ORDER — OXYCODONE HCL 5 MG PO TABS
5.0000 mg | ORAL_TABLET | Freq: Once | ORAL | Status: AC | PRN
  Administered 2019-09-01: 12:00:00 5 mg via ORAL

## 2019-09-01 MED ORDER — CELECOXIB 200 MG PO CAPS
ORAL_CAPSULE | ORAL | Status: AC
  Filled 2019-09-01: qty 1

## 2019-09-01 MED ORDER — MIDAZOLAM HCL 2 MG/2ML IJ SOLN
1.0000 mg | INTRAMUSCULAR | Status: DC | PRN
  Administered 2019-09-01 (×2): 2 mg via INTRAVENOUS

## 2019-09-01 MED ORDER — PROPOFOL 10 MG/ML IV BOLUS
INTRAVENOUS | Status: DC | PRN
  Administered 2019-09-01: 150 mg via INTRAVENOUS

## 2019-09-01 MED ORDER — HYDROMORPHONE HCL 1 MG/ML IJ SOLN
0.2500 mg | INTRAMUSCULAR | Status: DC | PRN
  Administered 2019-09-01 (×2): 0.5 mg via INTRAVENOUS

## 2019-09-01 MED ORDER — CEFAZOLIN SODIUM-DEXTROSE 2-4 GM/100ML-% IV SOLN
2.0000 g | INTRAVENOUS | Status: AC
  Administered 2019-09-01: 2 g via INTRAVENOUS

## 2019-09-01 MED ORDER — PROPOFOL 10 MG/ML IV BOLUS
INTRAVENOUS | Status: AC
  Filled 2019-09-01: qty 20

## 2019-09-01 MED ORDER — PROMETHAZINE HCL 25 MG/ML IJ SOLN: 6.2500 mg | INTRAMUSCULAR | Status: DC | PRN

## 2019-09-01 MED ORDER — GABAPENTIN 300 MG PO CAPS
ORAL_CAPSULE | ORAL | Status: AC
  Filled 2019-09-01: qty 1

## 2019-09-01 MED ORDER — LIDOCAINE 2% (20 MG/ML) 5 ML SYRINGE
INTRAMUSCULAR | Status: DC | PRN
  Administered 2019-09-01: 60 mg via INTRAVENOUS

## 2019-09-01 MED ORDER — OXYCODONE HCL 5 MG PO TABS
ORAL_TABLET | ORAL | Status: AC
  Filled 2019-09-01: qty 1

## 2019-09-01 MED ORDER — ACETAMINOPHEN 500 MG PO TABS
ORAL_TABLET | ORAL | Status: AC
  Filled 2019-09-01: qty 2

## 2019-09-01 MED ORDER — IBUPROFEN 800 MG PO TABS: 800.0000 mg | ORAL_TABLET | Freq: Three times a day (TID) | ORAL | 0 refills | Status: DC | PRN

## 2019-09-01 MED ORDER — DEXAMETHASONE SODIUM PHOSPHATE 10 MG/ML IJ SOLN
INTRAMUSCULAR | Status: DC | PRN
  Administered 2019-09-01: 10 mg via INTRAVENOUS

## 2019-09-01 MED ORDER — PHENYLEPHRINE 40 MCG/ML (10ML) SYRINGE FOR IV PUSH (FOR BLOOD PRESSURE SUPPORT)
PREFILLED_SYRINGE | INTRAVENOUS | Status: AC
  Filled 2019-09-01: qty 10

## 2019-09-01 MED ORDER — ACETAMINOPHEN 500 MG PO TABS
1000.0000 mg | ORAL_TABLET | Freq: Once | ORAL | Status: AC
  Administered 2019-09-01: 07:00:00 1000 mg via ORAL

## 2019-09-01 MED ORDER — SUGAMMADEX SODIUM 200 MG/2ML IV SOLN
INTRAVENOUS | Status: DC | PRN
  Administered 2019-09-01: 200 mg via INTRAVENOUS

## 2019-09-01 SURGICAL SUPPLY — 50 items
ADH SKN CLS APL DERMABOND .7 (GAUZE/BANDAGES/DRESSINGS) ×1
APL PRP STRL LF DISP 70% ISPRP (MISCELLANEOUS) ×1
APL SWBSTK 6 STRL LF DISP (MISCELLANEOUS) ×1
APPLICATOR COTTON TIP 6 STRL (MISCELLANEOUS) IMPLANT
APPLICATOR COTTON TIP 6IN STRL (MISCELLANEOUS) ×3
APPLIER CLIP LOGIC TI 5 (MISCELLANEOUS) IMPLANT
APR CLP MED LRG 33X5 (MISCELLANEOUS)
BLADE CLIPPER SURG (BLADE) ×2 IMPLANT
CANISTER SUCT 1200ML W/VALVE (MISCELLANEOUS) ×2 IMPLANT
CHLORAPREP W/TINT 26 (MISCELLANEOUS) ×3 IMPLANT
COVER WAND RF STERILE (DRAPES) ×3 IMPLANT
DERMABOND ADVANCED (GAUZE/BANDAGES/DRESSINGS) ×2
DERMABOND ADVANCED .7 DNX12 (GAUZE/BANDAGES/DRESSINGS) ×1 IMPLANT
DEVICE SECURE STRAP 25 ABSORB (INSTRUMENTS) ×4 IMPLANT
DISSECT BALLN SPACEMKR + OVL (BALLOONS) ×3
DISSECTOR BALLN SPACEMKR + OVL (BALLOONS) ×1 IMPLANT
DISSECTOR BLUNT TIP ENDO 5MM (MISCELLANEOUS) IMPLANT
DRAPE LAPAROSCOPIC ABDOMINAL (DRAPES) ×3 IMPLANT
ELECT REM PT RETURN 9FT ADLT (ELECTROSURGICAL) ×3
ELECTRODE REM PT RTRN 9FT ADLT (ELECTROSURGICAL) ×1 IMPLANT
GLOVE BIO SURGEON STRL SZ7 (GLOVE) ×2 IMPLANT
GLOVE BIO SURGEON STRL SZ8 (GLOVE) ×3 IMPLANT
GLOVE BIOGEL PI IND STRL 7.0 (GLOVE) IMPLANT
GLOVE BIOGEL PI IND STRL 7.5 (GLOVE) IMPLANT
GLOVE BIOGEL PI IND STRL 8 (GLOVE) ×1 IMPLANT
GLOVE BIOGEL PI INDICATOR 7.0 (GLOVE) ×4
GLOVE BIOGEL PI INDICATOR 7.5 (GLOVE) ×2
GLOVE BIOGEL PI INDICATOR 8 (GLOVE) ×2
GOWN STRL REUS W/ TWL LRG LVL3 (GOWN DISPOSABLE) ×3 IMPLANT
GOWN STRL REUS W/ TWL XL LVL3 (GOWN DISPOSABLE) ×1 IMPLANT
GOWN STRL REUS W/TWL LRG LVL3 (GOWN DISPOSABLE) ×3
GOWN STRL REUS W/TWL XL LVL3 (GOWN DISPOSABLE) ×6
MESH 3DMAX 4X6 LT LRG (Mesh General) ×2 IMPLANT
MESH 3DMAX 4X6 RT LRG (Mesh General) ×2 IMPLANT
NS IRRIG 1000ML POUR BTL (IV SOLUTION) ×3 IMPLANT
PACK BASIN DAY SURGERY FS (CUSTOM PROCEDURE TRAY) ×3 IMPLANT
SET IRRIG TUBING LAPAROSCOPIC (IRRIGATION / IRRIGATOR) ×2 IMPLANT
SET TUBE SMOKE EVAC HIGH FLOW (TUBING) ×3 IMPLANT
SLEEVE SCD COMPRESS KNEE MED (MISCELLANEOUS) ×2 IMPLANT
SUT MNCRL AB 4-0 PS2 18 (SUTURE) ×3 IMPLANT
SUT VICRYL 0 UR6 27IN ABS (SUTURE) ×2 IMPLANT
TOWEL GREEN STERILE FF (TOWEL DISPOSABLE) ×6 IMPLANT
TRAY FOL W/BAG SLVR 16FR STRL (SET/KITS/TRAYS/PACK) ×1 IMPLANT
TRAY FOLEY W/BAG SLVR 16FR LF (SET/KITS/TRAYS/PACK) ×3
TRAY LAPAROSCOPIC (CUSTOM PROCEDURE TRAY) ×3 IMPLANT
TROCAR XCEL BLADELESS 5X75MML (TROCAR) ×6 IMPLANT
TROCAR XCEL NON-BLD 5MMX100MML (ENDOMECHANICALS) ×4 IMPLANT
TUBE CONNECTING 20'X1/4 (TUBING) ×1
TUBE CONNECTING 20X1/4 (TUBING) ×1 IMPLANT
YANKAUER SUCT BULB TIP NO VENT (SUCTIONS) ×2 IMPLANT

## 2019-09-01 NOTE — Discharge Instructions (Signed)
[facility] _______[facility]  UMBILICAL OR INGUINAL HERNIA REPAIR: POST OP INSTRUCTIONS  Always review your discharge instruction sheet given to you by the facility where your surgery was performed. IF YOU HAVE DISABILITY OR FAMILY LEAVE FORMS, YOU MUST BRING THEM TO THE OFFICE FOR PROCESSING.   DO NOT GIVE THEM TO YOUR DOCTOR.  1. A  prescription for pain medication may be given to you upon discharge.  Take your pain medication as prescribed, if needed.  If narcotic pain medicine is not needed, then you may take acetaminophen (Tylenol) or ibuprofen (Advil) as needed. 2. Take your usually prescribed medications unless otherwise directed. If you need a refill on your pain medication, please contact your pharmacy.  They will contact our office to request authorization. Prescriptions will not be filled after 5 pm or on week-ends. 3. You should follow a light diet the first 24 hours after arrival home, such as soup and crackers, etc.  Be sure to include lots of fluids daily.  Resume your normal diet the day after surgery. 4.Most patients will experience some swelling and bruising around the umbilicus or in the groin and scrotum.  Ice packs and reclining will help.  Swelling and bruising can take several days to resolve.  6. It is common to experience some constipation if taking pain medication after surgery.  Increasing fluid intake and taking a stool softener (such as Colace) will usually help or prevent this problem from occurring.  A mild laxative (Milk of Magnesia or Miralax) should be taken according to package directions if there are no bowel movements after 48 hours. 7. Unless discharge instructions indicate otherwise, you may remove your bandages 24-48 hours after surgery, and you may shower at that time.  You may have steri-strips (small skin tapes) in place directly over the incision.  These strips should be left on the skin for 7-10 days.  If your surgeon used skin glue on the  incision, you may shower in 24 hours.  The glue will flake off over the next 2-3 weeks.  Any sutures or staples will be removed at the office during your follow-up visit. 8. ACTIVITIES:  You may resume regular (light) daily activities beginning the next day--such as daily self-care, walking, climbing stairs--gradually increasing activities as tolerated.  You may have sexual intercourse when it is comfortable.  Refrain from any heavy lifting or straining until approved by your doctor.  a.You may drive when you are no longer taking prescription pain medication, you can comfortably wear a seatbelt, and you can safely maneuver your car and apply brakes. b.RETURN TO WORK:   _____________________________________________  9.You should see your doctor in the office for a follow-up appointment approximately 2-3 weeks after your surgery.  Make sure that you call for this appointment within a day or two after you arrive home to insure a convenient appointment time. 10.OTHER INSTRUCTIONS: _________________________    _____________________________________  WHEN TO CALL YOUR DOCTOR: 1. Fever over 101.0 2. Inability to urinate 3. Nausea and/or vomiting 4. Extreme swelling or bruising 5. Continued bleeding from incision. 6. Increased pain, redness, or drainage from the incision  The clinic staff is available to answer your questions during regular business hours.  Please don't hesitate to call and ask to speak to one of the nurses for clinical concerns.  If you have a medical emergency, go to the nearest emergency room or call 911.  A surgeon from [facility] is always on call at the hospital     [address], [place], [place], [place]  [address] ?  [address], [place], [place]   [address] [phone] ? [phone] ? FAX [phone] Web site: [email or web address]   Post Anesthesia Home Care Instructions  Activity: Get plenty of rest for the remainder of the day. A  responsible individual must stay with you for 24 hours following the procedure.  For the next 24 hours, DO NOT: -Drive a car -Paediatric nurse -Drink alcoholic beverages -Take any medication unless instructed by your physician -Make any legal decisions or sign important papers.  Meals: Start with liquid foods such as gelatin or soup. Progress to regular foods as tolerated. Avoid greasy, spicy, heavy foods. If nausea and/or vomiting occur, drink only clear liquids until the nausea and/or vomiting subsides. Call your physician if vomiting continues.  Special Instructions/Symptoms: Your throat may feel dry or sore from the anesthesia or the breathing tube placed in your throat during surgery. If this causes discomfort, gargle with warm salt water. The discomfort should disappear within 24 hours.  If you had a scopolamine patch placed behind your ear for the management of post- operative nausea and/or vomiting:  1. The medication in the patch is effective for 72 hours, after which it should be removed.  Wrap patch in a tissue and discard in the trash. Wash hands thoroughly with soap and water. 2. You may remove the patch earlier than 72 hours if you experience unpleasant side effects which may include dry mouth, dizziness or visual disturbances. 3. Avoid touching the patch. Wash your hands with soap and water after contact with the patch.    *May take Tylenol at 1:30pm *May take Ibuprofen at 3:30pm

## 2019-09-01 NOTE — Transfer of Care (Signed)
Immediate Anesthesia Transfer of Care Note  Patient: Reagen Longenberger  Procedure(s) Performed: LAPAROSCOPIC BILATERAL INGUINAL HERNIA REPAIR WITH MESH (Bilateral Abdomen)  Patient Location: PACU  Anesthesia Type:General  Level of Consciousness: awake, alert  and oriented  Airway & Oxygen Therapy: Patient Spontanous Breathing and Patient connected to nasal cannula oxygen  Post-op Assessment: Report given to RN and Post -op Vital signs reviewed and stable  Post vital signs: Reviewed and stable  Last Vitals:  Vitals Value Taken Time  BP 111/67 09/01/19 1122  Temp    Pulse 70 09/01/19 1126  Resp 13 09/01/19 1126  SpO2 100 % 09/01/19 1126  Vitals shown include unvalidated device data.  Last Pain:  Vitals:   09/01/19 0703  TempSrc: Temporal  PainSc: 0-No pain         Complications: No apparent anesthesia complications

## 2019-09-01 NOTE — Anesthesia Procedure Notes (Signed)
Anesthesia Regional Block: TAP block   Pre-Anesthetic Checklist: ,, timeout performed, Correct Patient, Correct Site, Correct Laterality, Correct Procedure, Correct Position, site marked, Risks and benefits discussed,  Surgical consent,  Pre-op evaluation,  At surgeon's request and post-op pain management  Laterality: Right  Prep: chloraprep       Needles:  Injection technique: Single-shot  Needle Type: Echogenic Stimulator Needle     Needle Length: 10cm  Needle Gauge: 21     Additional Needles:   Procedures:,,,, ultrasound used (permanent image in chart),,,,  Narrative:  Start time: 09/01/2019 8:00 AM End time: 09/01/2019 8:10 AM Injection made incrementally with aspirations every 5 mL.  Performed by: Personally  Anesthesiologist: Murvin Natal, MD  Additional Notes: Functioning IV was confirmed and monitors were applied.  A timeout was performed. Sterile prep, hand hygiene and sterile gloves were used. A 166mm 21ga P[place] echogenic stimulator needle was used. Negative aspiration and negative test dose prior to incremental administration of local anesthetic. The patient tolerated the procedure well.  Ultrasound guidance: relevent anatomy identified, needle position confirmed, local anesthetic spread visualized around nerve(s), vascular puncture avoided.  Image printed for medical record.

## 2019-09-01 NOTE — Anesthesia Postprocedure Evaluation (Signed)
Anesthesia Post Note  Patient: William Garrison  Procedure(s) Performed: LAPAROSCOPIC BILATERAL INGUINAL HERNIA REPAIR WITH MESH (Bilateral Abdomen)     Patient location during evaluation: PACU Anesthesia Type: General and Regional Level of consciousness: awake and alert Pain management: pain level controlled Vital Signs Assessment: post-procedure vital signs reviewed and stable Respiratory status: spontaneous breathing, nonlabored ventilation, respiratory function stable and patient connected to nasal cannula oxygen Cardiovascular status: blood pressure returned to baseline and stable Postop Assessment: no apparent nausea or vomiting Anesthetic complications: no    Last Vitals:  Vitals:   09/01/19 1200 09/01/19 1230  BP: 120/84 (!) (P) 136/99  Pulse: 78 (P) 70  Resp: 15 (P) 16  Temp: 37 C (P) 37 C  SpO2: 94% (P) 98%    Last Pain:  Vitals:   09/01/19 1230  TempSrc:   PainSc: (P) 5                  Tori Dattilio P Dagmawi Venable

## 2019-09-01 NOTE — Progress Notes (Signed)
Assisted Dr. Roanna Banning with right, left, ultrasound guided, transabdominal plane block. Side rails up, monitors on throughout procedure. See vital signs in flow sheet. Tolerated Procedure well.

## 2019-09-01 NOTE — Anesthesia Procedure Notes (Signed)
Procedure Name: Intubation Date/Time: 09/01/2019 8:31 AM Performed by: [place] Ritisha Deitrick C, CRNA Pre-anesthesia Checklist: Patient identified, Emergency Drugs available, Suction available and Patient being monitored Patient Re-evaluated:Patient Re-evaluated prior to induction Oxygen Delivery Method: Circle system utilized Preoxygenation: Pre-oxygenation with 100% oxygen Induction Type: IV induction Ventilation: Mask ventilation without difficulty Laryngoscope Size: Mac and 4 Grade View: Grade II Tube type: Oral Tube size: 7.0 mm Number of attempts: 1 Airway Equipment and Method: Stylet and Oral airway Placement Confirmation: ETT inserted through vocal cords under direct vision,  positive ETCO2 and breath sounds checked- equal and bilateral Secured at: 22 cm Tube secured with: Tape Dental Injury: Teeth and Oropharynx as per pre-operative assessment

## 2019-09-01 NOTE — Interval H&P Note (Signed)
History and Physical Interval Note:  09/01/2019 8:11 AM  William Garrison  has presented today for surgery, with the diagnosis of BILATERAL INGUINAL HERNIA.  The various methods of treatment have been discussed with the patient and family. After consideration of risks, benefits and other options for treatment, the patient has consented to  Procedure(s): LAPAROSCOPIC BILATERAL INGUINAL HERNIA REPAIR WITH MESH (Bilateral) as a surgical intervention.  The patient's history has been reviewed, patient examined, no change in status, stable for surgery.  I have reviewed the patient's chart and labs.  Questions were answered to the patient's satisfaction.     [place]

## 2019-09-01 NOTE — Anesthesia Procedure Notes (Addendum)
Anesthesia Regional Block: TAP block   Pre-Anesthetic Checklist: ,, timeout performed, Correct Patient, Correct Site, Correct Laterality, Correct Procedure, Correct Position, site marked, Risks and benefits discussed,  Surgical consent,  Pre-op evaluation,  At surgeon's request and post-op pain management  Laterality: Left  Prep: chloraprep       Needles:  Injection technique: Single-shot  Needle Type: Echogenic Stimulator Needle     Needle Length: 10cm  Needle Gauge: 21     Additional Needles:   Procedures:,,,, ultrasound used (permanent image in chart),,,,  Narrative:  Start time: 09/01/2019 8:10 AM End time: 09/01/2019 8:20 AM Injection made incrementally with aspirations every 5 mL.  Performed by: Personally  Anesthesiologist: Murvin Natal, MD  Additional Notes: Functioning IV was confirmed and monitors were applied.  A timeout was performed. Sterile prep, hand hygiene and sterile gloves were used. A 131mm 21ga Pajunk echogenic stimulator needle was used. Negative aspiration and negative test dose prior to incremental administration of local anesthetic. The patient tolerated the procedure well.  Ultrasound guidance: relevent anatomy identified, needle position confirmed, local anesthetic spread visualized around nerve(s), vascular puncture avoided.  Image printed for medical record.

## 2019-09-01 NOTE — Op Note (Signed)
Preoperative diagnosis: Bilateral inguinal hernia reducible  Postoperative diagnosis: Bilateral inguinal hernia both being small indirect hernias reducible  Procedure: Laparoscopic repair of bilateral inguinal hernia with initial TEP approach converted to TAP approach due to peritoneal tear with dissection using balloon trocar of preperitoneal space with Bard 3D mesh  Surgeon: Erroll Luna, MD  Anesthesia: General with bilateral palpable TAP blocks and 0.25% Sensorcaine local  EBL minimal  Specimen: None  Findings: Small bilateral reducible indirect inguinal hernia  Indications for procedure: The patient is a 44 year old male who was initially evaluated for groin pain 2 months ago.  He was found to have a right inguinal hernia on examination which was felt to be small but symptomatic.  Prior to surgery, he did developed a right testicular epididymitis and was treated.  Part of the work-up by urology included a CT scan which was read as normal with the radiologist.  When I reviewed I felt he had a left inguinal hernia as well.  We met after that to rediscuss procedure since we are planning on doing an open right inguinal hernia.  I discussed the bilateral nature of his condition.  He is not symptomatic on his left side we discussed observing it versus a laparoscopic approach.  We discussed his going ahead and doing the right and watching the left.  He decided to have both repaired using the laparoscopic approach after discussion of the above.  I reviewed the procedure with him.  I reviewed I reviewed the laparoscopic techniques both extraperitoneal and intraperitoneal.  I reviewed the use of mesh on both.  I discussed risk of complication to include but not exclusive of bleeding, infection, injury to the cord structures, injury to major vascular structures, injury to the bladder, injury to the ureters, injury to the urethra, injury to the intestine, the need to convert to an open procedure, chronic  pain afterwards, possible cardiovascular event, possible pulmonary event, and unlikely death.  I discussed open approaches as well compared the 2.  Overall he was to proceed laparoscopically which we did today.  Description of procedure: The patient was met in the holding area and questions were answered.  He underwent bilateral blocks TAP of anesthesia.  He was taken back the operating.  He is placed supine upon the OR table.  After induction of general esthesia a Foley catheter was placed under sterile conditions to decompress the bladder.  Both arms were tucked and the abdomen was then prepped and draped in sterile fashion.  Timeout was performed.  A 1 cm infraumbilical incision was made.  This was dissected down to identified the left rectus sheath.  We opened it.  The midline was identified.  I did a blunt dissection posterior to the left rectus muscle to get into the preperitoneal space.  A Spacemaker balloon trocar was placed in the preperitoneal space and inflated under direct vision using the camera for visualization.  This was held for 30 seconds and then removed in.  The balloon was removed in its intact state.  This passed off the field.  We then placed the camera and insufflated the space to about 12 mmHg.  I placed 2 additional ports in the right lower quadrant and left lower quadrant.  We began dissection on the right and the peritoneum was extremely thin and friable.  In the process of dissecting this off the cord structures it tore creating a large intra-abdominal pneumoperitoneum.  Given this I felt that conversion to a transabdominal approach would be best  since this would make visualization much easier.  I then took the umbilical port and incised the posterior sheath of the rectus muscle and into the abdominal cavity.  He was repositioned in the intra-abdominal space and pneumoperitoneum was created to 35mHg pressure.  He is placed in Trendelenburg.  I converted the preperitoneal ports into  intra-abdominal ports as well.  The tear could be seen on the right side.  I went had to use the cautery to enter the preperitoneal space carefully without injuring the bladder.  This was done from the right lower quadrant to left lower quadrant thus exposing this preperitoneal space.  We then did blunt dissection to expose the pubis carefully and expose the Cooper's ligament bilaterally.  Both cord structures were identified.  On the right we dissected the peritoneal reflection away but was very thin and already ripped and it tore more.  I was able to separate this off the cord at this point and then dissect out the right cord structures.  There is a very small indirect hernia noted and that was reduced with full reduction in the lead point was reduced as well.  The cord otherwise was normal.  The right direct space was quite patulous and weak as well but no obvious herniation there yet.  I visualized the iliac vessels and we stayed away from those.  Care was taken to stay away from the nerve laterally.  In a similar fashion the left side was dissected out.  The pubis was identified as well and the Cooper's ligament was visualized.  The cord was dissected out.  This had fairly large lipoma and a small indirect hernia and this was dissected off the cord and reduced easily.  The cord was careful examined.  The direct site on the left was intact without weakening.  At this point I opted to use the Bard 3D mesh.  The right-sided mesh was brought in the field.  I did trim this a little bit given that it was quite large and he was a small pelvic male.  This was inserted and then placed into the right inguinal region.  This was lined up using the medial alignment.  This was tacked to Cooper's ligament using an absorbable tacker.  I placed 1-2 tacks along Cooper's ligament and then 2-3 medially and superiorly.  Tacks were avoided laterally.  Care was taken not to have any undue stress in the direct and indirect defects  were well covered.  In a similar fashion a Bard 3D left mesh was brought in the field and trimmed.  It was inserted in a similar fashion and inserted in the left inguinal region.  It was secured to Cooper's ligament using 2 absorbable tacks.  Through the tacks were put on the superior part of the mesh and tacks are not placed laterally to avoid entrapment of the nerve.  This cover the defect quite nicely.  The hernia sac on both sites remained reduced.  Hemostasis was achieved.  The protacker was used to tack the peritoneal lining back up to cover the mesh.  This was done and the mesh was completely covered with peritoneum and preperitoneal fat so none was exposed to the intra-abdominal contents.  There were no gaps in the peritoneum as well.  At this point I inspected the small intestine and it was normal.  The colon was normal as well without injury.  Prior to closing the peritoneum the bladder was well decompressed and there is no signs of  injury to the bladder or urethra or ureters.  Final inspection revealed no evidence of bleeding.  We then pulled the omentum down to cover the right lower quadrant viscera.  Overall, liver gallbladder and stomach all appeared normal.  We then removed our ports allowed the CO2 to escape.  Port sites were closed with 4-0 Monocryl.  The umbilical port site had a 0 Vicryl pursestring suture that was closed.  All counts were then correct.  Dermabond applied.  The urine was clear at this point upon inspection of Foley.  100 cc were placed into the Foley to mildly dilate the bladder to help facilitate postop voiding.  All counts were correct.  Foley was then removed.  Patient was awoke extubated taken recovery in satisfactory condition.

## 2019-09-02 ENCOUNTER — Encounter: Payer: Self-pay | Admitting: *Deleted

## 2019-09-02 NOTE — Addendum Note (Signed)
Addendum  created 09/02/19 0913 by Tawni Millers, CRNA   Charge Capture section accepted

## 2019-09-09 ENCOUNTER — Ambulatory Visit (INDEPENDENT_AMBULATORY_CARE_PROVIDER_SITE_OTHER)
Admission: RE | Admit: 2019-09-09 | Discharge: 2019-09-09 | Disposition: A | Discharge status: Home / Self Care | Payer: 59 | Source: Ambulatory Visit | Attending: Interventional Cardiology | Admitting: Interventional Cardiology

## 2019-09-09 ENCOUNTER — Other Ambulatory Visit: Payer: Self-pay

## 2019-09-09 DIAGNOSIS — I77810 Thoracic aortic ectasia: Secondary | ICD-10-CM

## 2019-09-09 MED ORDER — IOHEXOL 300 MG/ML  SOLN
100.0000 mL | Freq: Once | INTRAMUSCULAR | Status: AC | PRN
  Administered 2019-09-09: 100 mL via INTRAVENOUS

## 2019-09-13 ENCOUNTER — Other Ambulatory Visit: Payer: Self-pay

## 2019-09-13 DIAGNOSIS — I712 Thoracic aortic aneurysm, without rupture, unspecified: Secondary | ICD-10-CM

## 2019-09-23 ENCOUNTER — Other Ambulatory Visit: Payer: Self-pay | Admitting: Interventional Cardiology

## 2019-10-19 MED ORDER — ATORVASTATIN CALCIUM 20 MG PO TABS: 20.0000 mg | ORAL_TABLET | Freq: Every day | ORAL | 0 refills | Status: DC

## 2019-10-20 NOTE — Telephone Encounter (Signed)
Pt has scheduled an appt with Dayna Dunn PA-C on 10/27/19 at 1045. This will be a virtual visit, for overdue follow-up and to continue receiving refills of his cardiac medications.  Pt made aware of appt date and time by Scheduling dept.

## 2019-10-21 ENCOUNTER — Encounter: Payer: Self-pay | Admitting: Physician Assistant

## 2019-10-21 NOTE — Progress Notes (Addendum)
Virtual Visit via Telephone Note   This visit type was conducted due to national recommendations for restrictions regarding the COVID-19 Pandemic (e.g. social distancing) in an effort to limit this patient's exposure and mitigate transmission in our community.  Due to his co-morbid illnesses, this patient is at least at moderate risk for complications without adequate follow up.  This format is felt to be most appropriate for this patient at this time.  The patient did not have access to video technology/had technical difficulties with video requiring transitioning to audio format only (telephone).  All issues noted in this document were discussed and addressed.  No physical exam could be performed with this format.  Please refer to the patient's chart for his  consent to telehealth for [facility].   The patient was identified using 2 identifiers.  Date:  10/27/2019   ID:  William Garrison, DOB [birth year], MRN [id number]  Patient Location: Home Provider Location: Office  PCP:  London Pepper, MD  Cardiologist:  Ena Dawley, MD  Electrophysiologist:  None   Evaluation Performed:  Follow-Up Visit  Chief Complaint:  F/u aortic root aneurysm and mild CAD  History of Present Illness:    William Garrison is a 44 y.o. 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   He was seen by Dr. Meda Coffee 08/2017 for palpitations and DOE. He is a never smoker, former marathon runner. He quit running when his knees started giving him trouble. 2D echo showed normal LV function, mildly dilated aortic root, trace of AI, MR and TR. 48-hour monitor showed sinus bradycardia to sinus tachycardia with occasional PACs and PVCs felt to be essentially normal. He underwent coronary CT 09/2018 showing 91st percentile calcium score with  nonobstructive mixed plaque in the proximal LAD. Medical therapy recommended. He was not previously interested in a beta blocker due to hearing about side effects. His CT did note presence of aortic root aneurysm which has been followed by serial imaging. Last study 09/09/19 showed stable size at 4.3cm as well as hepatic steatosis. Last labs personally reviewed 08/2019 showed K 4.4, Cr 0.98, AST 74/ALT 97 (elevated compared to 07/2019, but lower than 12/2018), WBC 3.2, Hgb 15.2 - but done while ill, lipid profile 12/2018 with LDL 83.  He is seen back virtually by phone overall doing well without cardiac symptoms of chest pain, dyspnea, palpitations or syncope. He has not followed his BP recently but does note it has been elevated during office visits although this is typically when he is ill or injured. His cuff had been out of batteries so he replaced them today and got a reading of 134/99. He drinks 3-4 drinks daily. He does not recall previously being told of fatty liver. He is eager to begin exercising again. His father had an aneurysm that is being monitored.   Past Medical History:  Diagnosis Date  . Aortic root aneurysm (HCC)    mild  . Hepatic steatosis   . Hypertension   . Mild CAD   . Premature atrial contractions   . PVC's (premature ventricular contractions)   . Vitamin B 12 deficiency    Past Surgical History:  Procedure Laterality Date  . INGUINAL HERNIA REPAIR Bilateral 09/01/2019   Procedure: LAPAROSCOPIC BILATERAL INGUINAL HERNIA REPAIR WITH MESH;  Surgeon: Erroll Luna, MD;  Location: [facility];  Service: General;  Laterality: Bilateral;  . KNEE ARTHROSCOPY Left    x2  . WISDOM TOOTH EXTRACTION       Current Meds  Medication Sig  . atorvastatin (LIPITOR) 20 MG tablet Take 1 tablet (20 mg total) by mouth daily.  . cetirizine (ZYRTEC) 10 MG tablet Take 10 mg by mouth at bedtime.  . Ibuprofen (ADVIL PO) Take 200 mg by mouth as needed (headache).   . lisinopril  (PRINIVIL,ZESTRIL) 10 MG tablet Take 10 mg by mouth daily.  . [DISCONTINUED] atorvastatin (LIPITOR) 20 MG tablet Take 1 tablet (20 mg total) by mouth daily. Please make overdue appt with Dr. Meda Coffee before anymore refills. 3rd and Final attempt  . [DISCONTINUED] Cyanocobalamin (VITAMIN B 12 PO) Take 2,500 mcg by mouth daily.     Allergies:   Patient has no known allergies.   Social History   Tobacco Use  . Smoking status: Never Smoker  . Smokeless tobacco: Never Used  Substance Use Topics  . Alcohol use: Yes    Comment: 3-4 drinks daily  . Drug use: No     Family Hx: The patient's family history includes Aortic aneurysm in his father; Hypercalcemia in his mother; Hypertension in his father and mother; Stroke in his paternal grandfather.  ROS:   Please see the history of present illness.    All other systems reviewed and are negative.   Prior CV studies:   The following studies were reviewed today:  2D Echo 09/2017 - Left ventricle: The cavity size was normal. Wall thickness was  normal. Systolic function was normal. The estimated ejection  fraction was in the range of 55% to 60%. Wall motion was normal;  there were no regional wall motion abnormalities. Left  ventricular diastolic function parameters were normal.  - Aortic valve: There was trivial regurgitation.  - Aortic root: The aortic root was mildly dilated.  - Mitral valve: Calcified annulus.   Impressions:   - Normal LV function; mildly dilated aortic root; trace AI, MR and  TR.   CTA 09/2017 IMPRESSION: 1. Stable mild aneurysmal dilation of the aortic root measuring up to 4.3 cm. Recommend annual imaging followup by CTA or MRA. This recommendation follows 2010 ACCF/AHA/AATS/ACR/ASA/SCA/SCAI/SIR/STS/SVM Guidelines for the Diagnosis and Management of Patients with Thoracic Aortic Disease. Circulation. 2010; [id number]. Aortic aneurysm NOS (ICD10-I71.9) 2. Hepatic steatosis.  Coronary CTA  09/2018 ADDENDUM REPORT: 09/08/2018 13:55  CLINICAL DATA:  Chest pain  EXAM: Cardiac CTA  MEDICATIONS: Sub lingual nitro. 4mg  x 2  TECHNIQUE: The patient was scanned on a Siemens [id number] slice scanner. Gantry rotation speed was 250 msecs. Collimation was 0.6 mm. A 100 kV prospective scan was triggered in the ascending thoracic aorta at 35-75% of the R-R interval. Average HR during the scan was 60 bpm. The 3D data set was interpreted on a dedicated work station using MPR, MIP and VRT modes. A total of 80cc of contrast was used.  FINDINGS: Non-cardiac: See separate report from [facility].  Pulmonary veins drain normally to the left atrium. Enlarged aortic root, see radiology over-read for details.  Calcium Score: 46 Agatston units.  Coronary Arteries: Right dominant with no anomalies  LM: No plaque or stenosis.  LAD system: Mixed plaque proximal LAD with minimal stenosis.  Circumflex system: Large OM1 and OM2 with small AV LCx beyond OM2. No plaque or stenosis.  RCA system: No plaque or stenosis.  IMPRESSION: 1. Coronary artery calcium score 46 Agatston units. This places the patient in  the 91st percentile for age and gender, suggesting high risk for future cardiac events. 2.  Nonobstructive coronary disease in the proximal LAD. Dalton Mclean  Electronically Signed   By: Loralie Champagne M.D.   On: 09/08/2018 13:55    Labs/Other Tests and Data Reviewed:    EKG:  An ECG dated 08/30/19 was personally reviewed today and demonstrated:  NSR 70bpm with sinus arrhythmia, no acute STT changes  Recent Labs: 08/30/2019: ALT 97; BUN 7; Creatinine, Ser 0.98; Hemoglobin 15.2; Platelets 194; Potassium 4.4; Sodium 138   Recent Lipid Panel Lab Results  Component Value Date/Time   CHOL 254 (H) 12/07/2018 07:45 AM   TRIG 87 12/07/2018 07:45 AM   HDL 154 12/07/2018 07:45 AM   CHOLHDL 1.6 12/07/2018 07:45 AM   LDLCALC 83 12/07/2018 07:45 AM    Wt Readings  from Last 3 Encounters:  10/27/19 197 lb 1.4 oz (89.4 kg)  09/01/19 197 lb 1.5 oz (89.4 kg)  01/14/18 180 lb (81.6 kg)     Objective:    Vital Signs:  BP (!) 134/99   Pulse 72   Ht 6' (1.829 m)   Wt 197 lb 1.4 oz (89.4 kg)   BMI 26.73 kg/m    VS reviewed. General - calm male in no acute distress Pulm - No labored breathing, no coughing during visit, no audible wheezing, speaking in full sentences Neuro - A+Ox3, no slurred speech, answers questions appropriately Psych - Pleasant affect  ASSESSMENT & PLAN:    1. Aortic root aneurysm - stable in size by 09/2019 CT. He has an order in the system for repeat study in 1 year. Mainstays of aneurysm treatment reviewed with patient and will relay on MyChart AVS, including BP control, avoidance of heavy lifting or exercises which elicit Valsalva (I.e. sit-ups, chin-ups, push-ups), avoidance of fluoroquinolones, and beta blocker use. His blood pressure appears suboptimally controlled. Goal BP 105-120 if tolerated. Will trial carvedilol 3.125mg  BID in addition to his lisinopril. Given instructions to check blood pressure/HR after initiation and report back readings within 1 week. 2. Nonobstructive CAD - continue risk factor modification. Atorvastatin was started last year. He had abnormal LFTs earlier this year. Will have him return for CMET/lipid profile (fasting) to reassess. If LFTs are still abnormal would consider referral to GI given imaging finding of hepatic steatosis. No anginal symptoms. He has not been on aspirin - given mild degree of plaque and concomitant TAA will review with Dr. Meda Coffee. 3. Essential HTN - suboptimally controlled. Check TSH with labs when he returns. Plan as above. 4. PACs/PVCs - asymptomatic. Follow clinically. 5. Hepatic steatosis - discussed finding with patient and advised f/u with primary care along with reduction in ETOH. Trend LFTs with labs, given atorvastatin use. 6. Leukocytosis then leukopenia - WBC 13.4 then  3.2 noted on labs earlier this year, no prior baseline to compare. Suspect these were due to acute illness but will recheck for stability.  Time:   Today, I have spent 23 minutes with the patient with telehealth technology discussing the above problems.     Medication Adjustments/Labs and Tests Ordered: Current medicines are reviewed at length with the patient today.  Testing and concerns regarding medicines are outlined above.    Follow Up: Will offer in-office visit in 6 months since he has not been seen in person recently. If he declines will plan 1 year.  Signed, Charlie Pitter, PA-C  10/27/2019 11:48 AM    [place]

## 2019-10-27 ENCOUNTER — Telehealth: Payer: Self-pay | Admitting: *Deleted

## 2019-10-27 ENCOUNTER — Telehealth: Payer: Self-pay | Admitting: Physician Assistant

## 2019-10-27 ENCOUNTER — Other Ambulatory Visit: Payer: Self-pay

## 2019-10-27 ENCOUNTER — Telehealth (INDEPENDENT_AMBULATORY_CARE_PROVIDER_SITE_OTHER): Payer: 59 | Admitting: Physician Assistant

## 2019-10-27 ENCOUNTER — Encounter: Payer: Self-pay | Admitting: Physician Assistant

## 2019-10-27 VITALS — BP 134/99 | HR 72 | Ht 72.0 in | Wt 197.1 lb

## 2019-10-27 DIAGNOSIS — I2510 Atherosclerotic heart disease of native coronary artery without angina pectoris: Secondary | ICD-10-CM

## 2019-10-27 DIAGNOSIS — I491 Atrial premature depolarization: Secondary | ICD-10-CM

## 2019-10-27 DIAGNOSIS — I7121 Aneurysm of the ascending aorta, without rupture: Secondary | ICD-10-CM

## 2019-10-27 DIAGNOSIS — I10 Essential (primary) hypertension: Secondary | ICD-10-CM

## 2019-10-27 DIAGNOSIS — I719 Aortic aneurysm of unspecified site, without rupture: Secondary | ICD-10-CM | POA: Diagnosis not present

## 2019-10-27 DIAGNOSIS — K760 Fatty (change of) liver, not elsewhere classified: Secondary | ICD-10-CM

## 2019-10-27 DIAGNOSIS — Q2543 Congenital aneurysm of aorta: Secondary | ICD-10-CM

## 2019-10-27 DIAGNOSIS — I493 Ventricular premature depolarization: Secondary | ICD-10-CM

## 2019-10-27 DIAGNOSIS — D729 Disorder of white blood cells, unspecified: Secondary | ICD-10-CM

## 2019-10-27 MED ORDER — ASPIRIN EC 81 MG PO TBEC: 81.0000 mg | DELAYED_RELEASE_TABLET | Freq: Every day | ORAL | 3 refills | Status: AC

## 2019-10-27 MED ORDER — ATORVASTATIN CALCIUM 20 MG PO TABS: 20.0000 mg | ORAL_TABLET | Freq: Every day | ORAL | 1 refills | Status: DC

## 2019-10-27 MED ORDER — CARVEDILOL 3.125 MG PO TABS: 3.1250 mg | ORAL_TABLET | Freq: Two times a day (BID) | ORAL | 1 refills | Status: DC

## 2019-10-27 NOTE — Telephone Encounter (Signed)
Call placed to pt re: message below.  Pt made aware to start Aspirin 81 mg daily.

## 2019-10-27 NOTE — Telephone Encounter (Signed)
   Please let pt know I reviewed his chart with Dr. Meda Coffee. The question I raised to her is whether he should be on a baby aspirin given his history of mild plaque buildup on his CT scan in 09/2018. She feels he should be on baby aspirin 81mg  daily so please let him know. Thanks! Colena Ketterman PA-C

## 2019-10-27 NOTE — Telephone Encounter (Signed)
  Patient Consent for Virtual Visit         William Garrison has provided verbal consent on 10/27/2019 for a virtual visit (video or telephone).   CONSENT FOR VIRTUAL VISIT FOR:  William Garrison  By participating in this virtual visit I agree to the following:  I hereby voluntarily request, consent and authorize [facility] and its employed or contracted physicians, physician assistants, nurse practitioners or other licensed health care professionals (the Practitioner), to provide me with telemedicine health care services (the "Services") as deemed necessary by the treating Practitioner. I acknowledge and consent to receive the Services by the Practitioner via telemedicine. I understand that the telemedicine visit will involve communicating with the Practitioner through live audiovisual communication technology and the disclosure of certain medical information by electronic transmission. I acknowledge that I have been given the opportunity to request an in-person assessment or other available alternative prior to the telemedicine visit and am voluntarily participating in the telemedicine visit.  I understand that I have the right to withhold or withdraw my consent to the use of telemedicine in the course of my care at any time, without affecting my right to future care or treatment, and that the Practitioner or I may terminate the telemedicine visit at any time. I understand that I have the right to inspect all information obtained and/or recorded in the course of the telemedicine visit and may receive copies of available information for a reasonable fee.  I understand that some of the potential risks of receiving the Services via telemedicine include:  Marland Kitchen Delay or interruption in medical evaluation due to technological equipment failure or disruption; . Information transmitted may not be sufficient (e.g. poor resolution of images) to allow for appropriate medical decision making by the Practitioner;  and/or  . In rare instances, security protocols could fail, causing a breach of personal health information.  Furthermore, I acknowledge that it is my responsibility to provide information about my medical history, conditions and care that is complete and accurate to the best of my ability. I acknowledge that Practitioner's advice, recommendations, and/or decision may be based on factors not within their control, such as incomplete or inaccurate data provided by me or distortions of diagnostic images or specimens that may result from electronic transmissions. I understand that the practice of medicine is not an exact science and that Practitioner makes no warranties or guarantees regarding treatment outcomes. I acknowledge that a copy of this consent can be made available to me via my patient portal ([facility]), or I can request a printed copy by calling the office of [facility].    I understand that my insurance will be billed for this visit.   I have read or had this consent read to me. . I understand the contents of this consent, which adequately explains the benefits and risks of the Services being provided via telemedicine.  . I have been provided ample opportunity to ask questions regarding this consent and the Services and have had my questions answered to my satisfaction. . I give my informed consent for the services to be provided through the use of telemedicine in my medical care

## 2019-10-27 NOTE — Patient Instructions (Signed)
Medication Instructions:  Your physician has recommended you make the following change in your medication:  1.  START Carvedilol 3.125 taking 1 tablet twice a day   *If you need a refill on your cardiac medications before your next appointment, please call your pharmacy*   Lab Work: 11/01/19:  COME TO THE OFFICE, ANYTIME AFTER 7:30 A.M, FOR FASTING LABS:  CMET, CBC, TSH, & LIPID  If you have labs (blood work) drawn today and your tests are completely normal, you will receive your results only by: Marland Kitchen MyChart Message (if you have MyChart) OR . A paper copy in the mail If you have any lab test that is abnormal or we need to change your treatment, we will call you to review the results.   Testing/Procedures: None ordered   Follow-Up: At [facility], you and your health needs are our priority.  As part of our continuing mission to provide you with exceptional heart care, we have created designated Provider Care Teams.  These Care Teams include your primary Cardiologist (physician) and Advanced Practice Providers (APPs -  Physician Assistants and Nurse Practitioners) who all work together to provide you with the care you need, when you need it.  We recommend signing up for the patient portal called "MyChart".  Sign up information is provided on this After Visit Summary.  MyChart is used to connect with patients for Virtual Visits (Telemedicine).  Patients are able to view lab/test results, encounter notes, upcoming appointments, etc.  Non-urgent messages can be sent to your provider as well.   To learn more about what you can do with MyChart, go to [email or web address].    Your next appointment:   6 month(s)  The format for your next appointment:   In Person  Provider:   You may see Ena Dawley, MD or one of the following Advanced Practice Providers on your designated Care Team:    Melina Copa, PA-C  Ermalinda Barrios, PA-C    Other Instructions  One of your tests has shown  aneurysmal dilation of your aortic root. The word "aneurysm" refers to a bulge in an artery (blood vessel). Most people think of them in the context of an emergency, but yours was found incidentally. At this point there is nothing you need to do from a procedure standpoint, but there are some important things to keep in mind for day-to-day life.   Mainstays of therapy for aneurysms include very good blood pressure control, healthy lifestyle, and avoiding tobacco products and street drugs. Research has raised concern that antibiotics in the fluoroquinolone class could be associated with increased risk of having an aneurysm develop or tear. This includes medicines that end in "floxacin," like Cipro or Levaquin. Make sure to discuss this information with other healthcare providers if you require antibiotics.   Since aneurysms can run in families, you should discuss your diagnosis with first degree relatives as they may need to be screened for this. Regular mild-moderate physical exercise is important, but avoid heavy lifting/weight lifting over 30lbs, chopping wood, shoveling snow or digging heavy earth with a shovel. It is best to avoid activities that cause grunting or straining (medically referred to as a "Valsalva maneuver"). This happens when a person bears down against a closed throat to increase the strength of arm or abdominal muscles. There's often a tendency to do this when lifting heavy weights, doing sit-ups, push-ups or chin-ups, etc., but it may be harmful.   This is a finding I would expect to be monitored  periodically by your cardiology team. Most unruptured thoracic aortic aneurysms cause no symptoms, so they are often found during exams for other conditions. Contact a health care provider if you develop any discomfort in your upper back, neck, abdomen, trouble swallowing, cough or hoarseness, or unexplained weight loss. Get help right away if you develop severe pain in your upper back or abdomen  that may move into your chest and arms, or any other concerning symptoms such as shortness of breath or fever.     To check your blood pressure: We recommend the blood pressure cuffs that go on your upper arm. The wrist ones can be inaccurate. If possible, try to select one that also reports your heart rate. To check your blood pressure, choose a time at least 3 hours after taking your blood pressure medicines. If you can check it at different times of the day at first, that's helpful - it might give you more information about how your blood pressure fluctuates. Remain seated in a chair for 5 minutes quietly beforehand, then check it. When you get a cuff, please record a list of those readings and call us/send in MyChart message with them for our review in 1 week.

## 2019-11-01 ENCOUNTER — Other Ambulatory Visit: Payer: 59

## 2019-11-05 ENCOUNTER — Other Ambulatory Visit: Payer: Self-pay

## 2019-11-05 ENCOUNTER — Other Ambulatory Visit: Payer: 59 | Admitting: *Deleted

## 2019-11-05 DIAGNOSIS — I7121 Aneurysm of the ascending aorta, without rupture: Secondary | ICD-10-CM

## 2019-11-05 DIAGNOSIS — D729 Disorder of white blood cells, unspecified: Secondary | ICD-10-CM

## 2019-11-05 DIAGNOSIS — I10 Essential (primary) hypertension: Secondary | ICD-10-CM

## 2019-11-05 DIAGNOSIS — K760 Fatty (change of) liver, not elsewhere classified: Secondary | ICD-10-CM

## 2019-11-05 DIAGNOSIS — Q2543 Congenital aneurysm of aorta: Secondary | ICD-10-CM

## 2019-11-05 DIAGNOSIS — I491 Atrial premature depolarization: Secondary | ICD-10-CM

## 2019-11-05 DIAGNOSIS — I493 Ventricular premature depolarization: Secondary | ICD-10-CM

## 2019-11-05 DIAGNOSIS — I2510 Atherosclerotic heart disease of native coronary artery without angina pectoris: Secondary | ICD-10-CM

## 2019-11-05 DIAGNOSIS — I719 Aortic aneurysm of unspecified site, without rupture: Secondary | ICD-10-CM

## 2019-11-05 LAB — CBC
Hematocrit: 41.8 % (ref 37.5–51.0)
Hemoglobin: 14.6 g/dL (ref 13.0–17.7)
MCH: 33.3 pg — ABNORMAL HIGH (ref 26.6–33.0)
MCHC: 34.9 g/dL (ref 31.5–35.7)
MCV: 95 fL (ref 79–97)
Platelets: 220 10*3/uL (ref 150–450)
RBC: 4.39 x10E6/uL (ref 4.14–5.80)
RDW: 12.3 % (ref 11.6–15.4)
WBC: 3.9 10*3/uL (ref 3.4–10.8)

## 2019-11-05 LAB — COMPREHENSIVE METABOLIC PANEL
ALT: 81 IU/L — ABNORMAL HIGH (ref 0–44)
AST: 38 IU/L (ref 0–40)
Albumin/Globulin Ratio: 2 (ref 1.2–2.2)
Albumin: 4.3 g/dL (ref 4.0–5.0)
Alkaline Phosphatase: 73 IU/L (ref 39–117)
BUN/Creatinine Ratio: 8 — ABNORMAL LOW (ref 9–20)
BUN: 7 mg/dL (ref 6–24)
Bilirubin Total: 0.4 mg/dL (ref 0.0–1.2)
CO2: 23 mmol/L (ref 20–29)
Calcium: 9.3 mg/dL (ref 8.7–10.2)
Chloride: 102 mmol/L (ref 96–106)
Creatinine, Ser: 0.9 mg/dL (ref 0.76–1.27)
GFR calc Af Amer: 121 mL/min/{1.73_m2} (ref >59)
GFR calc non Af Amer: 104 mL/min/{1.73_m2} (ref >59)
Globulin, Total: 2.2 g/dL (ref 1.5–4.5)
Glucose: 102 mg/dL — ABNORMAL HIGH (ref 65–99)
Potassium: 4.4 mmol/L (ref 3.5–5.2)
Sodium: 140 mmol/L (ref 134–144)
Total Protein: 6.5 g/dL (ref 6.0–8.5)

## 2019-11-05 LAB — LIPID PANEL
Chol/HDL Ratio: 2.2 ratio (ref 0.0–5.0)
Cholesterol, Total: 184 mg/dL (ref 100–199)
HDL: 84 mg/dL (ref >39)
LDL Chol Calc (NIH): 86 mg/dL (ref 0–99)
Triglycerides: 75 mg/dL (ref 0–149)
VLDL Cholesterol Cal: 14 mg/dL (ref 5–40)

## 2019-11-05 LAB — TSH: TSH: 1.84 u[IU]/mL (ref 0.450–4.500)

## 2019-11-08 ENCOUNTER — Telehealth: Payer: Self-pay | Admitting: *Deleted

## 2019-11-08 DIAGNOSIS — K760 Fatty (change of) liver, not elsewhere classified: Secondary | ICD-10-CM

## 2019-11-08 DIAGNOSIS — E785 Hyperlipidemia, unspecified: Secondary | ICD-10-CM

## 2019-11-08 NOTE — Telephone Encounter (Signed)
Pt notified. He does not have preference for GI doctor but PCP is with [facility]. Patient agreeable to referral to [facility].  He will come in for fasting lab work on August 3,2021

## 2019-11-08 NOTE — Telephone Encounter (Signed)
-----   Message from Charlie Pitter, [place] sent at 11/08/2019  7:55 AM EDT ----- Please let pt know labs are generally stable except a few mild abnormalities. Glucose mildly elevated. One of his liver function numbers is still elevated. It's better than 2 months ago, but still slightly abnormal. I would suggest referral to GI for hepatic steatosis (fatty liver) and this abnormal liver function test. His LDL is not totally at goal, LDL of 86, goal is less than 70. However, before titrating cholesterol medication further, would suggest GI input on whether OK to increase statin further. In the meantime I would suggest a trial of lifestyle modification to try and improve cholesterol that way- optimize healthy diet/physical activity. Recheck lipid profile and LFTs in 3 months. Dayna Dunn PA-C

## 2019-12-15 NOTE — Telephone Encounter (Signed)
Disregard, Rosann Auerbach! Meant to send to patient.

## 2020-01-20 MED ORDER — CARVEDILOL 3.125 MG PO TABS: 3.1250 mg | ORAL_TABLET | Freq: Two times a day (BID) | ORAL | 2 refills | Status: DC

## 2020-02-08 ENCOUNTER — Other Ambulatory Visit: Payer: 59

## 2020-02-15 ENCOUNTER — Other Ambulatory Visit: Payer: No Typology Code available for payment source

## 2020-02-15 ENCOUNTER — Other Ambulatory Visit: Payer: Self-pay

## 2020-02-15 DIAGNOSIS — E785 Hyperlipidemia, unspecified: Secondary | ICD-10-CM

## 2020-02-15 LAB — LIPID PANEL
Chol/HDL Ratio: 2 ratio (ref 0.0–5.0)
Cholesterol, Total: 214 mg/dL — ABNORMAL HIGH (ref 100–199)
HDL: 107 mg/dL (ref >39)
LDL Chol Calc (NIH): 91 mg/dL (ref 0–99)
Triglycerides: 92 mg/dL (ref 0–149)
VLDL Cholesterol Cal: 16 mg/dL (ref 5–40)

## 2020-02-15 LAB — HEPATIC FUNCTION PANEL
ALT: 84 IU/L — ABNORMAL HIGH (ref 0–44)
AST: 71 IU/L — ABNORMAL HIGH (ref 0–40)
Albumin: 4.5 g/dL (ref 4.0–5.0)
Alkaline Phosphatase: 75 IU/L (ref 48–121)
Bilirubin Total: 0.4 mg/dL (ref 0.0–1.2)
Bilirubin, Direct: 0.15 mg/dL (ref 0.00–0.40)
Total Protein: 6.7 g/dL (ref 6.0–8.5)

## 2020-02-17 ENCOUNTER — Telehealth: Payer: Self-pay | Admitting: *Deleted

## 2020-02-17 DIAGNOSIS — Z79899 Other long term (current) drug therapy: Secondary | ICD-10-CM

## 2020-02-17 MED ORDER — ROSUVASTATIN CALCIUM 20 MG PO TABS: 20.0000 mg | ORAL_TABLET | Freq: Every day | ORAL | 3 refills | Status: DC

## 2020-02-17 NOTE — Telephone Encounter (Signed)
-----   Message from Charlie Pitter, [place] sent at 02/17/2020  9:28 AM EDT ----- Thank you! It looks like Dr. Therisa Doyne said OK to continue statin as long as LFTs are monitored. In this case we are wondering about whether she would find it acceptable to increase statin potency. Can you please place a call into Dr. Encarnacion Slates office with the most recent liver function levels, and ask her opinion on whether she would be OK with [name] changing atorvastatin 20mg  to rosuvastatin 20mg  daily? (Rosuvastatin is less extensively metabolized by the liver and may be a better option to achieve the goal LDL we are looking for.) If she gives the OK, that would be my recommendation to switch since LDL is still greater than 70, with repeat LFTs/lipids in 6 weeks. Would also let patient know his lab values and that we are reaching out to GI for input -he's actively engaged on MyChart so I assume is likely waiting for our result call. Dayna

## 2020-02-17 NOTE — Telephone Encounter (Signed)
Amy from Dr. Encarnacion Slates office called back to report that Dr. Therisa Doyne is ok with the switch from Atorvastatin to Rosuvastatin, just would need to follow his LFT's.  Pt has been made aware of his lab results.  He will stop the Atorvastatin and start Rosuvastatin 20 mg qd and repeat Lipid / Lft's 03/31/20.Marland Kitchen

## 2020-02-17 NOTE — Telephone Encounter (Signed)
Called Dr. Encarnacion Slates office, spoke with Amy, regarding message below. She will get with Dr. Therisa Doyne and call us back and let us know.

## 2020-03-31 ENCOUNTER — Other Ambulatory Visit: Payer: No Typology Code available for payment source

## 2020-04-07 ENCOUNTER — Other Ambulatory Visit: Payer: No Typology Code available for payment source | Admitting: *Deleted

## 2020-04-07 ENCOUNTER — Other Ambulatory Visit: Payer: Self-pay

## 2020-04-07 ENCOUNTER — Other Ambulatory Visit: Payer: Self-pay | Admitting: *Deleted

## 2020-04-07 DIAGNOSIS — I2510 Atherosclerotic heart disease of native coronary artery without angina pectoris: Secondary | ICD-10-CM

## 2020-04-07 DIAGNOSIS — Z8249 Family history of ischemic heart disease and other diseases of the circulatory system: Secondary | ICD-10-CM

## 2020-04-07 DIAGNOSIS — Z79899 Other long term (current) drug therapy: Secondary | ICD-10-CM

## 2020-04-07 DIAGNOSIS — R931 Abnormal findings on diagnostic imaging of heart and coronary circulation: Secondary | ICD-10-CM

## 2020-04-07 DIAGNOSIS — E785 Hyperlipidemia, unspecified: Secondary | ICD-10-CM

## 2020-04-07 LAB — HEPATIC FUNCTION PANEL
ALT: 89 IU/L — ABNORMAL HIGH (ref 0–44)
AST: 75 IU/L — ABNORMAL HIGH (ref 0–40)
Albumin: 4.4 g/dL (ref 4.0–5.0)
Alkaline Phosphatase: 71 IU/L (ref 44–121)
Bilirubin Total: 0.3 mg/dL (ref 0.0–1.2)
Bilirubin, Direct: 0.14 mg/dL (ref 0.00–0.40)
Total Protein: 6.8 g/dL (ref 6.0–8.5)

## 2020-04-07 LAB — LIPID PANEL
Chol/HDL Ratio: 1.9 ratio (ref 0.0–5.0)
Cholesterol, Total: 203 mg/dL — ABNORMAL HIGH (ref 100–199)
HDL: 107 mg/dL (ref >39)
LDL Chol Calc (NIH): 83 mg/dL (ref 0–99)
Triglycerides: 71 mg/dL (ref 0–149)
VLDL Cholesterol Cal: 13 mg/dL (ref 5–40)

## 2020-04-07 NOTE — Progress Notes (Signed)
New orders for lipid and LFTs requested to be placed on this pt, by Lab Leim Fabry.  Dayna Dunn PA-C ordered for this pt to have done.  Pt is here now in the office to get these drawn. Lab orders placed.

## 2020-04-10 ENCOUNTER — Telehealth: Payer: Self-pay | Admitting: *Deleted

## 2020-04-10 DIAGNOSIS — E785 Hyperlipidemia, unspecified: Secondary | ICD-10-CM

## 2020-04-10 NOTE — Telephone Encounter (Signed)
-----   Message from Jeanann Lewandowsky, [place] sent at 04/10/2020  3:46 PM EDT ----- Please call pt.  Thanks!

## 2020-04-10 NOTE — Telephone Encounter (Signed)
Pt has been notified lab results and recommendations by phone with verbal understanding. Pt is agreeable to referral to Lipid Clinic, referral has been placed.  Pt advised to be sure to follow a heart healthy diet. I will send a message to [facility] to schedule Lipid Clinic appt at the [facility]. Office. Pt thanked me for the call and the help. Patient notified of result. Please refer to phone note from today for complete details.   Julaine Hua, [place] 04/10/2020 4:03 PM

## 2020-04-21 ENCOUNTER — Other Ambulatory Visit: Payer: Self-pay

## 2020-04-21 ENCOUNTER — Ambulatory Visit (INDEPENDENT_AMBULATORY_CARE_PROVIDER_SITE_OTHER): Payer: No Typology Code available for payment source | Admitting: Pharmacist

## 2020-04-21 DIAGNOSIS — E785 Hyperlipidemia, unspecified: Secondary | ICD-10-CM | POA: Insufficient documentation

## 2020-04-21 DIAGNOSIS — E782 Mixed hyperlipidemia: Secondary | ICD-10-CM

## 2020-04-21 DIAGNOSIS — Z8249 Family history of ischemic heart disease and other diseases of the circulatory system: Secondary | ICD-10-CM

## 2020-04-21 MED ORDER — NEXLIZET 180-10 MG PO TABS: 1.0000 | ORAL_TABLET | Freq: Every day | ORAL | 3 refills | Status: DC

## 2020-04-21 NOTE — Progress Notes (Signed)
Patient ID: William Garrison                 DOB: 1976-02-16                    MRN: [id number]     HPI: William Garrison is a 44 y.o. male patient referred to lipid clinic by Dr Meda Coffee. 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 Pt was recently changed from atorvastatin to rosuvastatin however LDL remains above goal and LFTs remain mildly elevated.  Pt presents today in good spirits. Reports tolerating his rosuvastatin well. Has a strong family history of CAD, his father had stents placed in his 19s. His sister recently had a few mini strokes and is 44 years old.   [facility] has previously reached to GI Dr Therisa Doyne who is ok with pt continuing on statin as long as LFTs are monitored.  Current Medications: rosuvastatin 20mg  daily  Risk Factors: CAD, elevated calcium score, family history of premature CAD LDL goal: 70mg /dL  Diet: Avoids salt and fried food, uses Hello Fresh.  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  Family History: Aortic aneurysm and CAD in his father; Hypercalcemia in his mother; Hypertension in his father and mother; Stroke in his paternal grandfather. Mini strokes in his sister.  Social History: Denies tobacco use, 3-4 drinks daily, denies illicit drug use.  Labs: 04/07/20: TC 203, TG 71, HDL 107, LDL 83, AST 75, ALT 89 (rosuvastatin 20mg  daily) 02/15/20: TC 214, TG 92, HDL 107, LDL 91, AST 71, ALT 84 (atorvastatin 20mg  daily)  Past Medical History:  Diagnosis Date  . Aortic root aneurysm (HCC)    mild  . Hepatic steatosis   . Hypertension   . Mild CAD   . Premature atrial contractions   . PVC's (premature ventricular contractions)   . Vitamin B 12 deficiency     Current Outpatient Medications on File Prior to Visit    Medication Sig Dispense Refill  . aspirin EC 81 MG tablet Take 1 tablet (81 mg total) by mouth daily. 90 tablet 3  . atorvastatin (LIPITOR) 20 MG tablet Take 1 tablet (20 mg total) by mouth daily. 90 tablet 1  . carvedilol (COREG) 3.125 MG tablet Take 1 tablet (3.125 mg total) by mouth 2 (two) times daily. 180 tablet 2  . cetirizine (ZYRTEC) 10 MG tablet Take 10 mg by mouth at bedtime.    . Ibuprofen (ADVIL PO) Take 200 mg by mouth as needed (headache).     . lisinopril (PRINIVIL,ZESTRIL) 10 MG tablet Take 10 mg by mouth daily.    . rosuvastatin (CRESTOR) 20 MG tablet Take 1 tablet (20 mg total) by mouth daily. 90 tablet 3   No current facility-administered medications on file prior to visit.    No Known Allergies  Assessment/Plan:  1. Hyperlipidemia - LDL 83 above goal < 70 due to CAD noted on calcium score as well as strong family history of premature CAD. Discussed that aggressive lipid management will help reduce risk of future heart disease. Will start Nexlizet to provide an additional 40% LDL lowering. $9 copay card activated and called to pharmacy in addition to rx since e-prescribing is currently down. Pt will continue on rosuvastatin 20mg  daily. Will recheck NMR  lipid panel, ApoB, Lp(a), and LFTs in 6 weeks (earlier recheck due to history of slightly elevated LFTs).  Nikeshia Keetch E. Trayquan Kolakowski, PharmD, BCACP, [place] [address], [place], [place] [address] Phone: [phone]; Fax: [phone] 04/21/2020 11:04 AM

## 2020-04-21 NOTE — Patient Instructions (Addendum)
It was nice to meet you today!  Your LDL is 83 and your goal is < 70.  I'll submit information to your insurance to see if they will cover Nexlizet. This is a pill that contains 2 medications that will lower your LDL by close to 40%. Otherwise, we will plan to start ezetimibe (Zetia) which lowers your LDL by an additional 20%.  Continue taking rosuvastatin 20mg  daily.  Recheck fasting cholesterol on Wednesday, December 1st. Come in any time after 7:30am for fasting labs. We'll check an advanced lipid panel to look at your cholesterol particle size as well.

## 2020-05-06 ENCOUNTER — Other Ambulatory Visit: Payer: Self-pay | Admitting: Physician Assistant

## 2020-05-16 ENCOUNTER — Other Ambulatory Visit: Payer: Self-pay | Admitting: Pharmacist

## 2020-05-16 DIAGNOSIS — E782 Mixed hyperlipidemia: Secondary | ICD-10-CM

## 2020-05-16 MED ORDER — ROSUVASTATIN CALCIUM 20 MG PO TABS: 20.0000 mg | ORAL_TABLET | Freq: Every day | ORAL | 0 refills | Status: DC

## 2020-05-31 ENCOUNTER — Telehealth: Payer: Self-pay | Admitting: Physician Assistant

## 2020-05-31 NOTE — Telephone Encounter (Signed)
Scheduled appointment per 11/24 new patient referral. Spoke to patient who is aware of appointment date and time.

## 2020-06-07 ENCOUNTER — Other Ambulatory Visit: Payer: No Typology Code available for payment source

## 2020-06-12 ENCOUNTER — Telehealth: Payer: Self-pay | Admitting: Pharmacist

## 2020-06-12 NOTE — Telephone Encounter (Signed)
Pt missed lab work last week, left message to r/s.

## 2020-06-15 NOTE — Telephone Encounter (Signed)
Left message again.

## 2020-06-19 ENCOUNTER — Telehealth: Payer: Self-pay | Admitting: Hematology

## 2020-06-19 NOTE — Telephone Encounter (Signed)
Pt has been cld and rescheduled to see Dr. Irene Limbo on 12/23 at 1pm. Pt aware to arrive 30 minutes early.

## 2020-06-20 ENCOUNTER — Inpatient Hospital Stay: Payer: No Typology Code available for payment source | Admitting: Physician Assistant

## 2020-06-20 ENCOUNTER — Inpatient Hospital Stay: Payer: No Typology Code available for payment source

## 2020-06-21 ENCOUNTER — Other Ambulatory Visit: Payer: No Typology Code available for payment source | Admitting: *Deleted

## 2020-06-21 ENCOUNTER — Other Ambulatory Visit: Payer: Self-pay

## 2020-06-21 DIAGNOSIS — Z8249 Family history of ischemic heart disease and other diseases of the circulatory system: Secondary | ICD-10-CM

## 2020-06-21 DIAGNOSIS — E782 Mixed hyperlipidemia: Secondary | ICD-10-CM

## 2020-06-23 LAB — NMR, LIPOPROFILE
Cholesterol, Total: 217 mg/dL — ABNORMAL HIGH (ref 100–199)
HDL Particle Number: 50.7 umol/L (ref >=30.5)
HDL-C: 123 mg/dL (ref >39)
LDL Particle Number: 585 nmol/L (ref <1000)
LDL Size: 21 nm (ref >20.5)
LDL-C (NIH Calc): 74 mg/dL (ref 0–99)
LP-IR Score: 33 (ref <=45)
Small LDL Particle Number: <90 nmol/L (ref <=527)
Triglycerides: 123 mg/dL (ref 0–149)

## 2020-06-23 LAB — LIPOPROTEIN A (LPA): Lipoprotein (a): 19.5 nmol/L (ref <75.0)

## 2020-06-23 LAB — APOLIPOPROTEIN B: Apolipoprotein B: 59 mg/dL (ref <90)

## 2020-06-23 LAB — HEPATIC FUNCTION PANEL
ALT: 62 IU/L — ABNORMAL HIGH (ref 0–44)
AST: 50 IU/L — ABNORMAL HIGH (ref 0–40)
Albumin: 4.6 g/dL (ref 4.0–5.0)
Alkaline Phosphatase: 63 IU/L (ref 44–121)
Bilirubin Total: 0.3 mg/dL (ref 0.0–1.2)
Bilirubin, Direct: 0.18 mg/dL (ref 0.00–0.40)
Total Protein: 7.1 g/dL (ref 6.0–8.5)

## 2020-06-26 ENCOUNTER — Other Ambulatory Visit: Payer: Self-pay | Admitting: Pharmacist

## 2020-06-26 MED ORDER — LISINOPRIL 10 MG PO TABS: 10.0000 mg | ORAL_TABLET | Freq: Every day | ORAL | 3 refills | Status: DC

## 2020-06-29 ENCOUNTER — Inpatient Hospital Stay: Payer: No Typology Code available for payment source | Attending: Physician Assistant | Admitting: Hematology

## 2020-06-29 ENCOUNTER — Other Ambulatory Visit: Payer: Self-pay

## 2020-06-29 ENCOUNTER — Inpatient Hospital Stay: Payer: No Typology Code available for payment source

## 2020-06-29 VITALS — BP 147/100 | HR 71 | Temp 97.6°F | Resp 18 | Ht 72.0 in | Wt 202.0 lb

## 2020-06-29 DIAGNOSIS — Z79899 Other long term (current) drug therapy: Secondary | ICD-10-CM | POA: Insufficient documentation

## 2020-06-29 DIAGNOSIS — Z832 Family history of diseases of the blood and blood-forming organs and certain disorders involving the immune mechanism: Secondary | ICD-10-CM

## 2020-06-29 DIAGNOSIS — I10 Essential (primary) hypertension: Secondary | ICD-10-CM | POA: Diagnosis not present

## 2020-06-29 DIAGNOSIS — Z7982 Long term (current) use of aspirin: Secondary | ICD-10-CM | POA: Diagnosis not present

## 2020-06-29 DIAGNOSIS — Z96652 Presence of left artificial knee joint: Secondary | ICD-10-CM | POA: Diagnosis not present

## 2020-06-29 DIAGNOSIS — D688 Other specified coagulation defects: Secondary | ICD-10-CM | POA: Diagnosis not present

## 2020-06-29 LAB — CBC WITH DIFFERENTIAL/PLATELET
Abs Immature Granulocytes: 0.01 10*3/uL (ref 0.00–0.07)
Basophils Absolute: 0 10*3/uL (ref 0.0–0.1)
Basophils Relative: 1 %
Eosinophils Absolute: 0 10*3/uL (ref 0.0–0.5)
Eosinophils Relative: 1 %
HCT: 43.2 % (ref 39.0–52.0)
Hemoglobin: 14.9 g/dL (ref 13.0–17.0)
Immature Granulocytes: 0 %
Lymphocytes Relative: 30 %
Lymphs Abs: 1.3 10*3/uL (ref 0.7–4.0)
MCH: 32 pg (ref 26.0–34.0)
MCHC: 34.5 g/dL (ref 30.0–36.0)
MCV: 92.9 fL (ref 80.0–100.0)
Monocytes Absolute: 0.4 10*3/uL (ref 0.1–1.0)
Monocytes Relative: 9 %
Neutro Abs: 2.6 10*3/uL (ref 1.7–7.7)
Neutrophils Relative %: 59 %
Platelets: 205 10*3/uL (ref 150–400)
RBC: 4.65 MIL/uL (ref 4.22–5.81)
RDW: 12.4 % (ref 11.5–15.5)
WBC: 4.3 10*3/uL (ref 4.0–10.5)
nRBC: 0 % (ref 0.0–0.2)

## 2020-06-29 NOTE — Progress Notes (Signed)
HEMATOLOGY/ONCOLOGY CONSULTATION NOTE  Date of Service: 06/29/2020  Patient Care Team: London Pepper, MD as PCP - General (Family Medicine) Dorothy Spark, MD as PCP - Cardiology (Cardiology)  CHIEF COMPLAINTS/PURPOSE OF CONSULTATION:  Elevated Factor VIII  HISTORY OF PRESENTING ILLNESS:   William Garrison is a wonderful 44 y.o. male who has been referred to [name] by Dr. London Pepper for evaluation and management of elevated Factor VIII. The pt reports that he is doing well overall.   The pt reports that both of his sisters have had elevated Factor VIII levels. One of his sisters has had 3-4 TIAs. Pt had his Factor VIII levels checked based on his family history and Aortic aneurysm. He denies any injury, stress, or recent vaccination around the time of his November labs.   Pt is on two medications to treat his hypertension. He is also on Crestor and a daily baby Aspirin. He was recently placed on Nexlizet, which is improving his cholesterol levels. Pt had a bilateral inguinal hernia repair in February of 2021. He also had a left knee replacement about four years ago.   Most recent lab results (05/26/2020) of CBC is as follows: all values are WNL except for WBC at 2.8K, MCV at 95.3, Neutro Abs at 1.6K, Lymphs Abs at 0.90K, AST at 43, ALT at 56. 05/26/2020 Factor VIII Activity at 144  On review of systems, pt denies leg swelling, SOB, back pain, abdominal pain and any other symptoms.   On PMHx the pt reports Aortic root aneurysm, Knee Arthroscopy, Inguinal Hernia Repair b/l, Hepatic steatosis, HTN. On Social Hx the pt reports that he is a non-smoker and drinks 2-3 glasses of wine per day. On Family Hx the pt reports no family history of blood clots. His father has RA.   MEDICAL HISTORY:  Past Medical History:  Diagnosis Date  . Aortic root aneurysm (HCC)    mild  . Hepatic steatosis   . Hypertension   . Mild CAD   . Premature atrial contractions   . PVC's (premature ventricular  contractions)   . Vitamin B 12 deficiency     SURGICAL HISTORY: Past Surgical History:  Procedure Laterality Date  . INGUINAL HERNIA REPAIR Bilateral 09/01/2019   Procedure: LAPAROSCOPIC BILATERAL INGUINAL HERNIA REPAIR WITH MESH;  Surgeon: Erroll Luna, MD;  Location: [facility];  Service: General;  Laterality: Bilateral;  . KNEE ARTHROSCOPY Left    x2  . WISDOM TOOTH EXTRACTION      SOCIAL HISTORY: Social History   Socioeconomic History  . Marital status: Married    Spouse name: Not on file  . Number of children: Not on file  . Years of education: Not on file  . Highest education level: Not on file  Occupational History  . Not on file  Tobacco Use  . Smoking status: Never Smoker  . Smokeless tobacco: Never Used  Vaping Use  . Vaping Use: Never used  Substance and Sexual Activity  . Alcohol use: Yes    Comment: 3-4 drinks daily  . Drug use: No  . Sexual activity: Not on file    Comment: married  Other Topics Concern  . Not on file  Social History Narrative  . Not on file   Social Determinants of Health   Financial Resource Strain: Not on file  Food Insecurity: Not on file  Transportation Needs: Not on file  Physical Activity: Not on file  Stress: Not on file  Social Connections: Not on  file  Intimate Partner Violence: Not on file    FAMILY HISTORY: Family History  Problem Relation Age of Onset  . Hypercalcemia Mother   . Hypertension Mother   . Aortic aneurysm Father   . Hypertension Father   . Stroke Paternal Grandfather     ALLERGIES:  has No Known Allergies.  MEDICATIONS:  Current Outpatient Medications  Medication Sig Dispense Refill  . aspirin EC 81 MG tablet Take 1 tablet (81 mg total) by mouth daily. 90 tablet 3  . Bempedoic Acid-Ezetimibe (NEXLIZET) 180-10 MG TABS Take 1 tablet by mouth daily. 90 tablet 3  . carvedilol (COREG) 3.125 MG tablet Take 1 tablet (3.125 mg total) by mouth 2 (two) times daily. 180 tablet 2  .  cetirizine (ZYRTEC) 10 MG tablet Take 10 mg by mouth at bedtime.    . Ibuprofen (ADVIL PO) Take 200 mg by mouth as needed (headache).     . lisinopril (ZESTRIL) 10 MG tablet Take 1 tablet (10 mg total) by mouth daily. 90 tablet 3  . rosuvastatin (CRESTOR) 20 MG tablet Take 1 tablet (20 mg total) by mouth daily. 30 tablet 0   No current facility-administered medications for this visit.    REVIEW OF SYSTEMS:    10 Point review of Systems was done is negative except as noted above.  PHYSICAL EXAMINATION: ECOG PERFORMANCE STATUS: 0 - Asymptomatic  . Vitals:   06/29/20 1307  BP: (!) 147/100  Pulse: 71  Resp: 18  Temp: 97.6 F (36.4 C)  SpO2: 99%   Filed Weights   06/29/20 1307  Weight: 202 lb (91.6 kg)   .Body mass index is 27.4 kg/m.  GENERAL:alert, in no acute distress and comfortable SKIN: no acute rashes, no significant lesions EYES: conjunctiva are pink and non-injected, sclera anicteric OROPHARYNX: MMM, no exudates, no oropharyngeal erythema or ulceration NECK: supple, no JVD LYMPH:  no palpable lymphadenopathy in the cervical, axillary or inguinal regions LUNGS: clear to auscultation b/l with normal respiratory effort HEART: regular rate & rhythm ABDOMEN:  normoactive bowel sounds , non tender, not distended. Extremity: no pedal edema PSYCH: alert & oriented x 3 with fluent speech NEURO: no focal motor/sensory deficits  LABORATORY DATA:  I have reviewed the data as listed  . CBC Latest Ref Rng & Units 06/29/2020 11/05/2019 08/30/2019  WBC 4.0 - 10.5 K/uL 4.3 3.9 3.2(L)  Hemoglobin 13.0 - 17.0 g/dL 14.9 14.6 15.2  Hematocrit 39.0 - 52.0 % 43.2 41.8 44.3  Platelets 150 - 400 K/uL 205 220 194    . CMP Latest Ref Rng & Units 06/21/2020 04/07/2020 02/15/2020  Glucose 65 - 99 mg/dL - - -  BUN 6 - 24 mg/dL - - -  Creatinine 0.76 - 1.27 mg/dL - - -  Sodium 134 - 144 mmol/L - - -  Potassium 3.5 - 5.2 mmol/L - - -  Chloride 96 - 106 mmol/L - - -  CO2 20 - 29 mmol/L  - - -  Calcium 8.7 - 10.2 mg/dL - - -  Total Protein 6.0 - 8.5 g/dL 7.1 6.8 6.7  Total Bilirubin 0.0 - 1.2 mg/dL 0.3 0.3 0.4  Alkaline Phos 44 - 121 IU/L 63 71 75  AST 0 - 40 IU/L 50(H) 75(H) 71(H)  ALT 0 - 44 IU/L 62(H) 89(H) 84(H)     RADIOGRAPHIC STUDIES: I have personally reviewed the radiological images as listed and agreed with the findings in the report. No results found.  ASSESSMENT & PLAN:   44  y/o with no personal h/o VTE with   1) Elevated factor 8 levels. Notes fhx of elevated factor 8 levels PLAN: -Discussed patient's most recent labs from 05/26/2020, all values are WNL except for WBC at 2.8K, MCV at 95.3, Neutro Abs at 1.6K, Lymphs Abs at 0.90K, AST at 43, ALT at 56. -Discussed 05/26/2020 Factor VIII Activity at 144 - advised pt that his levels were not significantly elevated.  -Advised pt that stressors such as: surgery, blood loss, injury, certain medications, and certain types of tumors could cause temporary elevation of Factor VIII levels.  -Advised pt that Factor VIII levels can also be elevated reactively from heart attacks and strokes.  -Advised pt that a Factor VIII elevation of >150% increases the risk of venous blood clots by about 5x age/medical condition matched population. -Advised pt that a strong FHx of venous blood clots would justify an extensive genetic w/u but we are not seeing that.  -Advised pt that doing heart-healthy activities such as eating a well-balanced diet and staying active would reduce risk of blood clots.  -Advised pt that Factor VIII could be rechecked to see if it is persistent - Will get labs today.  -Will see back as needed    FOLLOW UP: Labs today RTC with Dr Irene Limbo if needed based on labs   All of the patients questions were answered with apparent satisfaction. The patient knows to call the clinic with any problems, questions or concerns.  I spent 26mins counseling the patient face to face. The total time spent in the  appointment was 45 minutes and more than 50% was on counseling and direct patient cares.    Sullivan Lone MD [place] AAHIVMS [facility] Hematology/Oncology Physician [facility]  (Office):       [phone] (Work cell):  [phone] (Fax):           [phone]  06/29/2020 4:12 PM  I, Yevette Edwards, am acting as a scribe for Dr. Sullivan Lone.   .I have reviewed the above documentation for accuracy and completeness, and I agree with the above. Brunetta Genera MD    Addendum  Component     Latest Ref Rng & Units 06/29/2020  WBC     4.0 - 10.5 K/uL 4.3  RBC     4.22 - 5.81 MIL/uL 4.65  Hemoglobin     13.0 - 17.0 g/dL 14.9  HCT     39.0 - 52.0 % 43.2  MCV     80.0 - 100.0 fL 92.9  MCH     26.0 - 34.0 pg 32.0  MCHC     30.0 - 36.0 g/dL 34.5  RDW     11.5 - 15.5 % 12.4  Platelets     150 - 400 K/uL 205  nRBC     0.0 - 0.2 % 0.0  Neutrophils     % 59  NEUT#     1.7 - 7.7 K/uL 2.6  Lymphocytes     % 30  Lymphocyte #     0.7 - 4.0 K/uL 1.3  Monocytes Relative     % 9  Monocyte #     0.1 - 1.0 K/uL 0.4  Eosinophil     % 1  Eosinophils Absolute     0.0 - 0.5 K/uL 0.0  Basophil     % 1  Basophils Absolute     0.0 - 0.1 K/uL 0.0  Immature Granulocytes     % 0  Abs Immature  Granulocytes     0.00 - 0.07 K/uL 0.01  Coagulation Factor VIII     56 - 140 % 163 (H)   Plan -FVIII levels slightly elevated. -VTE risk factor reduction interventions as discussed. -no indication for prophylactic anticoagulation. Reasonable to consider low dose ASA if indicated based on cardiac risk profiling.  Brunetta Genera MD

## 2020-06-30 LAB — FACTOR 8 ASSAY: Coagulation Factor VIII: 163 % — ABNORMAL HIGH (ref 56–140)

## 2020-07-05 ENCOUNTER — Telehealth: Payer: Self-pay | Admitting: *Deleted

## 2020-07-05 NOTE — Telephone Encounter (Signed)
Contacted patient regarding test results per Dr. Clyda Greener directions. Patient verbalized understanding of all information.

## 2020-07-05 NOTE — Telephone Encounter (Signed)
-----   Message from Johney Maine, MD sent at 07/04/2020  9:40 PM EST ----- Plz let patient know FVIII levels are borderline elevated at 163%-- could be a risk factor for VTE as discussed. Continue VTE risk reduction strategies as discussed. No indication for prophylactic therapeutic anticoagulation. Blood count WNL

## 2020-07-26 ENCOUNTER — Other Ambulatory Visit: Payer: Self-pay | Admitting: Cardiology

## 2020-09-14 ENCOUNTER — Other Ambulatory Visit: Payer: Self-pay

## 2020-09-14 ENCOUNTER — Ambulatory Visit (INDEPENDENT_AMBULATORY_CARE_PROVIDER_SITE_OTHER)
Admission: RE | Admit: 2020-09-14 | Discharge: 2020-09-14 | Disposition: A | Discharge status: Home / Self Care | Payer: No Typology Code available for payment source | Source: Ambulatory Visit | Attending: Interventional Cardiology | Admitting: Interventional Cardiology

## 2020-09-14 DIAGNOSIS — I712 Thoracic aortic aneurysm, without rupture, unspecified: Secondary | ICD-10-CM

## 2020-09-14 MED ORDER — IOHEXOL 350 MG/ML SOLN
100.0000 mL | Freq: Once | INTRAVENOUS | Status: AC | PRN
  Administered 2020-09-14: 100 mL via INTRAVENOUS

## 2020-09-18 ENCOUNTER — Other Ambulatory Visit: Payer: Self-pay | Admitting: *Deleted

## 2020-09-18 DIAGNOSIS — I712 Thoracic aortic aneurysm, without rupture, unspecified: Secondary | ICD-10-CM

## 2020-10-01 NOTE — Progress Notes (Signed)
Cardiology Office Note:    Date:  10/03/2020   ID:  William Garrison, DOB 01-27-1976, MRN [id number]  PCP:  London Pepper, MD   [place]  Cardiologist:  Freada Bergeron, MD  Advanced Practice Provider:  No care team member to display Electrophysiologist:  None    Referring MD: London Pepper, MD    History of Present Illness:    William Garrison is a 45 y.o. male with a hx of aortic root aneurysm, hypertension, PACs, PVCs, nonobstructive CAD by CT 09/2018, abnormal LFTs, hepatic steatosis by imaging, and family history of CAD (father having stents in his 22s) who presents to clinic for follow-up.  He was seen by Dr. Meda Coffee 08/2017 for palpitations and DOE. He is a never smoker, former marathon runner. He quit running when his knees started giving him trouble. 2D echo showed normal LV function, mildly dilated aortic root, trace of AI, MR and TR. 48-hour monitor showed sinus bradycardia to sinus tachycardia with occasional PACs and PVCs felt to be essentially normal. He underwent coronary CT 09/2018 showing 91st percentile calcium score with nonobstructive mixed plaque in the proximal LAD. Medical therapy recommended. He was not previously interested in a beta blocker due to hearing about side effects. His CT did note presence of aortic root aneurysm which has been followed by serial imaging. Last study 09/09/19 showed stable size at 4.3cm as well as hepatic steatosis.   Was last seen on 10/27/19 by Melina Copa where he was doing well. Has since undergone his annual CTA aorta 09/26/20 which showed a stable aortic root dilation measuring 4.1cm at SoV.  The patient states that he feels overall well. No chest pain, SOB, palpitations, LE edema. Active and walks without exertional symptoms. Tolerating all medications as prescribed.   Of note, his father has a history of aortic root aneurysm and is undergoing connective tissue disease genetic work-up at [facility] Results are  currently pending.   Patient's sister was also diagnosed with elevated Factor VIII  and the patient underwent testing and was also positive. No history of clots. On ASA 81mg  daily.  Past Medical History:  Diagnosis Date  . Aortic root aneurysm (HCC)    mild  . Hepatic steatosis   . Hypertension   . Mild CAD   . Premature atrial contractions   . PVC's (premature ventricular contractions)   . Vitamin B 12 deficiency     Past Surgical History:  Procedure Laterality Date  . INGUINAL HERNIA REPAIR Bilateral 09/01/2019   Procedure: LAPAROSCOPIC BILATERAL INGUINAL HERNIA REPAIR WITH MESH;  Surgeon: Erroll Luna, MD;  Location: [place];  Service: General;  Laterality: Bilateral;  . KNEE ARTHROSCOPY Left    x2  . WISDOM TOOTH EXTRACTION      Current Medications: Current Meds  Medication Sig  . aspirin EC 81 MG tablet Take 1 tablet (81 mg total) by mouth daily.  . Bempedoic Acid-Ezetimibe (NEXLIZET) 180-10 MG TABS Take 1 tablet by mouth daily.  . carvedilol (COREG) 3.125 MG tablet Take 1 tablet (3.125 mg total) by mouth 2 (two) times daily. Please make yearly appt with Dr. Meda Coffee for April 2022 for future refills. Thank you 1st attempt  . cetirizine (ZYRTEC) 10 MG tablet Take 10 mg by mouth at bedtime.  . Ibuprofen (ADVIL PO) Take 200 mg by mouth as needed (headache).   . lisinopril (ZESTRIL) 10 MG tablet Take 1 tablet (10 mg total) by mouth daily.     Allergies:  Patient has no known allergies.   Social History   Socioeconomic History  . Marital status: Married    Spouse name: Not on file  . Number of children: Not on file  . Years of education: Not on file  . Highest education level: Not on file  Occupational History  . Not on file  Tobacco Use  . Smoking status: Never Smoker  . Smokeless tobacco: Never Used  Vaping Use  . Vaping Use: Never used  Substance and Sexual Activity  . Alcohol use: Yes    Comment: 3-4 drinks daily  . Drug use: No  .  Sexual activity: Not on file    Comment: married  Other Topics Concern  . Not on file  Social History Narrative  . Not on file   Social Determinants of Health   Financial Resource Strain: Not on file  Food Insecurity: Not on file  Transportation Needs: Not on file  Physical Activity: Not on file  Stress: Not on file  Social Connections: Not on file     Family History: The patient's family history includes Aortic aneurysm in his father; Hypercalcemia in his mother; Hypertension in his father and mother; Stroke in his paternal grandfather.  ROS:   Please see the history of present illness.    Review of Systems  Constitutional: Negative for chills and fever.  HENT: Negative for hearing loss.   Eyes: Negative for blurred vision and redness.  Respiratory: Negative for shortness of breath.   Cardiovascular: Negative for chest pain, palpitations, orthopnea, claudication, leg swelling and PND.  Gastrointestinal: Negative for melena, nausea and vomiting.  Genitourinary: Negative for dysuria and flank pain.  Musculoskeletal: Positive for joint pain. Negative for myalgias.  Neurological: Negative for dizziness and loss of consciousness.  Endo/Heme/Allergies: Negative for polydipsia.  Psychiatric/Behavioral: Negative for substance abuse.    EKGs/Labs/Other Studies Reviewed:    The following studies were reviewed today: 2D Echo 09/2017 - Left ventricle: The cavity size was normal. Wall thickness was  normal. Systolic function was normal. The estimated ejection  fraction was in the range of 55% to 60%. Wall motion was normal;  there were no regional wall motion abnormalities. Left  ventricular diastolic function parameters were normal.  - Aortic valve: There was trivial regurgitation.  - Aortic root: The aortic root was mildly dilated.  - Mitral valve: Calcified annulus.   Impressions:   - Normal LV function; mildly dilated aortic root; trace AI, MR and  TR.   CTA  09/2017 IMPRESSION: 1. Stable mild aneurysmal dilation of the aortic root measuring up to 4.3 cm. Recommend annual imaging followup by CTA or MRA. This recommendation follows 2010 ACCF/AHA/AATS/ACR/ASA/SCA/SCAI/SIR/STS/SVM Guidelines for the Diagnosis and Management of Patients with Thoracic Aortic Disease. Circulation. 2010; 121: O832-P498. Aortic aneurysm NOS (ICD10-I71.9) 2. Hepatic steatosis.  Coronary CTA 09/2018 ADDENDUM REPORT: 09/08/2018 13:55  CLINICAL DATA: Chest pain  EXAM: Cardiac CTA  MEDICATIONS: Sub lingual nitro. 4mg  x 2  TECHNIQUE: The patient was scanned on a Siemens 264 slice scanner. Gantry rotation speed was 250 msecs. Collimation was 0.6 mm. A 100 kV prospective scan was triggered in the ascending thoracic aorta at 35-75% of the R-R interval. Average HR during the scan was 60 bpm. The 3D data set was interpreted on a dedicated work station using MPR, MIP and VRT modes. A total of 80cc of contrast was used.  FINDINGS: Non-cardiac: See separate report from [facility].  Pulmonary veins drain normally to the left atrium. Enlarged aortic root,  see radiology over-read for details.  Calcium Score: 46 Agatston units.  Coronary Arteries: Right dominant with no anomalies  LM: No plaque or stenosis.  LAD system: Mixed plaque proximal LAD with minimal stenosis.  Circumflex system: Large OM1 and OM2 with small AV LCx beyond OM2. No plaque or stenosis.  RCA system: No plaque or stenosis.  IMPRESSION: 1. Coronary artery calcium score 46 Agatston units. This places the patient in the 91st percentile for age and gender, suggesting high risk for future cardiac events. 2. Nonobstructive coronary disease in the proximal LAD.  EKG:  EKG is  ordered today.  The ekg ordered today demonstrates NSR with HR 60  Recent Labs: 11/05/2019: BUN 7; Creatinine, Ser 0.90; Potassium 4.4; Sodium 140; TSH 1.840 06/21/2020: ALT 62 06/29/2020:  Hemoglobin 14.9; Platelets 205  Recent Lipid Panel    Component Value Date/Time   CHOL 203 (H) 04/07/2020 1035   TRIG 71 04/07/2020 1035   HDL 107 04/07/2020 1035   CHOLHDL 1.9 04/07/2020 1035   LDLCALC 83 04/07/2020 1035      Physical Exam:    VS:  BP (!) 120/100   Pulse 60   Ht 6' (1.829 m)   Wt 206 lb 3.2 oz (93.5 kg)   SpO2 98%   BMI 27.97 kg/m     Wt Readings from Last 3 Encounters:  10/03/20 206 lb 3.2 oz (93.5 kg)  06/29/20 202 lb (91.6 kg)  10/27/19 197 lb 1.4 oz (89.4 kg)     GEN:  Well nourished, well developed in no acute distress HEENT: Normal NECK: No JVD; No carotid bruits CARDIAC: RRR, no murmurs, rubs, gallops RESPIRATORY:  Clear to auscultation without rales, wheezing or rhonchi  ABDOMEN: Soft, non-tender, non-distended MUSCULOSKELETAL:  No edema; No deformity  SKIN: Warm and dry NEUROLOGIC:  Alert and oriented x 3 PSYCHIATRIC:  Normal affect   ASSESSMENT:    1. PVC's (premature ventricular contractions)   2. Thoracic aortic aneurysm without rupture ([place])   3. Mixed hyperlipidemia   4. Family history of early CAD   32. Mild CAD   6. Essential hypertension   7. Premature atrial contractions    PLAN:    In order of problems listed above:  #Aortic root aneurysm -  Stable in size on yearly CTA in 09/2020 measuring 4.1cm. Father is currently undergoing connective tissue disease genetic work-up with results currently. Blood pressure is fairly well controlled.  -Continue coreg 3.125mg  daily -Continue lisinopril 10mg  daily -Monitor blood pressures at home and if BP>120/80s, will increase lisinopril -Avoid heavy lifting -Will follow-up father's genetic testing and test the patient and first degree relatives pending results  #Nonobstructive CAD: Asymptomatic and stable Calcium score 46 in 2020 with no obstructive disease on CTA.  -Continue ASA 81mg  dialy -Continue crestor 20mg  daily -Continue nexlizet 180-10mg    #Essential HTN : -Continue  coreg and lisinopril as above -Monitor blood pressure on home cuff with goal <120s/80s  #PACs/PVCs : Aasymptomatic. Follow clinically.  #Elevated Factor VIII: Recently diagnosed. No history of DVT/PE. On ASA. -Continue ASA -Daughter will need to be tested as has implications for pregnancy   Medication Adjustments/Labs and Tests Ordered: Current medicines are reviewed at length with the patient today.  Concerns regarding medicines are outlined above.  Orders Placed This Encounter  Procedures  . EKG 12-Lead   No orders of the defined types were placed in this encounter.   Patient Instructions  Medication Instructions:  Your physician recommends that you continue on your current medications  as directed. Please refer to the Current Medication list given to you today.  *If you need a refill on your cardiac medications before your next appointment, please call your pharmacy*  Follow-Up: At [facility], you and your health needs are our priority.  As part of our continuing mission to provide you with exceptional heart care, we have created designated Provider Care Teams.  These Care Teams include your primary Cardiologist (physician) and Advanced Practice Providers (APPs -  Physician Assistants and Nurse Practitioners) who all work together to provide you with the care you need, when you need it.  Your next appointment:   1 year(s)  The format for your next appointment:   In Person  Provider:   You may see Freada Bergeron, MD or one of the following Advanced Practice Providers on your designated Care Team:    Richardson Dopp, PA-C  Robbie Lis, [place]  Other Instructions Follow up sooner if needed    Signed, Freada Bergeron, MD  10/03/2020 3:16 PM    [place]

## 2020-10-03 ENCOUNTER — Other Ambulatory Visit: Payer: Self-pay

## 2020-10-03 ENCOUNTER — Encounter: Payer: Self-pay | Admitting: Cardiology

## 2020-10-03 ENCOUNTER — Ambulatory Visit: Payer: No Typology Code available for payment source | Admitting: Cardiology

## 2020-10-03 VITALS — BP 120/100 | HR 60 | Ht 72.0 in | Wt 206.2 lb

## 2020-10-03 DIAGNOSIS — Z8249 Family history of ischemic heart disease and other diseases of the circulatory system: Secondary | ICD-10-CM | POA: Diagnosis not present

## 2020-10-03 DIAGNOSIS — E782 Mixed hyperlipidemia: Secondary | ICD-10-CM

## 2020-10-03 DIAGNOSIS — I10 Essential (primary) hypertension: Secondary | ICD-10-CM

## 2020-10-03 DIAGNOSIS — I493 Ventricular premature depolarization: Secondary | ICD-10-CM

## 2020-10-03 DIAGNOSIS — I491 Atrial premature depolarization: Secondary | ICD-10-CM

## 2020-10-03 DIAGNOSIS — I712 Thoracic aortic aneurysm, without rupture, unspecified: Secondary | ICD-10-CM

## 2020-10-03 DIAGNOSIS — I2510 Atherosclerotic heart disease of native coronary artery without angina pectoris: Secondary | ICD-10-CM

## 2020-10-03 NOTE — Patient Instructions (Signed)
Medication Instructions:  Your physician recommends that you continue on your current medications as directed. Please refer to the Current Medication list given to you today.  *If you need a refill on your cardiac medications before your next appointment, please call your pharmacy*  Follow-Up: At [facility], you and your health needs are our priority.  As part of our continuing mission to provide you with exceptional heart care, we have created designated Provider Care Teams.  These Care Teams include your primary Cardiologist (physician) and Advanced Practice Providers (APPs -  Physician Assistants and Nurse Practitioners) who all work together to provide you with the care you need, when you need it.  Your next appointment:   1 year(s)  The format for your next appointment:   In Person  Provider:   You may see Freada Bergeron, MD or one of the following Advanced Practice Providers on your designated Care Team:    Richardson Dopp, PA-C  [place], [place]  Other Instructions Follow up sooner if needed

## 2020-11-08 ENCOUNTER — Ambulatory Visit
Admission: RE | Admit: 2020-11-08 | Discharge: 2020-11-08 | Disposition: A | Discharge status: Home / Self Care | Payer: No Typology Code available for payment source | Source: Ambulatory Visit | Attending: Family Medicine | Admitting: Family Medicine

## 2020-11-08 ENCOUNTER — Other Ambulatory Visit: Payer: Self-pay | Admitting: Family Medicine

## 2020-11-08 ENCOUNTER — Other Ambulatory Visit: Payer: Self-pay

## 2020-11-08 DIAGNOSIS — R0602 Shortness of breath: Secondary | ICD-10-CM

## 2020-12-25 ENCOUNTER — Other Ambulatory Visit: Payer: Self-pay | Admitting: *Deleted

## 2020-12-25 MED ORDER — CARVEDILOL 3.125 MG PO TABS: 3.1250 mg | ORAL_TABLET | Freq: Two times a day (BID) | ORAL | 3 refills | Status: DC

## 2021-02-14 ENCOUNTER — Other Ambulatory Visit: Payer: Self-pay | Admitting: Physician Assistant

## 2021-02-14 DIAGNOSIS — E782 Mixed hyperlipidemia: Secondary | ICD-10-CM

## 2021-04-19 ENCOUNTER — Other Ambulatory Visit: Payer: Self-pay

## 2021-04-19 MED ORDER — NEXLIZET 180-10 MG PO TABS: 1.0000 | ORAL_TABLET | Freq: Every day | ORAL | 1 refills | Status: DC

## 2021-07-22 ENCOUNTER — Encounter: Payer: Self-pay | Admitting: Cardiology

## 2021-07-24 ENCOUNTER — Other Ambulatory Visit: Payer: Self-pay

## 2021-07-24 MED ORDER — NEXLIZET 180-10 MG PO TABS: 1.0000 | ORAL_TABLET | Freq: Every day | ORAL | 0 refills | Status: DC

## 2021-08-31 ENCOUNTER — Encounter: Payer: Self-pay | Admitting: Cardiology

## 2021-08-31 ENCOUNTER — Telehealth: Payer: Self-pay | Admitting: Cardiology

## 2021-08-31 DIAGNOSIS — I712 Thoracic aortic aneurysm, without rupture, unspecified: Secondary | ICD-10-CM

## 2021-08-31 DIAGNOSIS — I77810 Thoracic aortic ectasia: Secondary | ICD-10-CM

## 2021-08-31 DIAGNOSIS — Z0189 Encounter for other specified special examinations: Secondary | ICD-10-CM

## 2021-08-31 MED ORDER — LISINOPRIL 10 MG PO TABS: 10.0000 mg | ORAL_TABLET | Freq: Every day | ORAL | 0 refills | Status: DC

## 2021-08-31 NOTE — Telephone Encounter (Signed)
Patient would like to have labs done prior to his visit on 10/30/21.

## 2021-08-31 NOTE — Telephone Encounter (Signed)
Pt will have his routine follow-up CT Angio Chest Aorta done on 3/10 and will have his yearly appt with Dr. Johney Frame on 10/30/21.  Pt states he had a free physical with labs done through his employer about 2 months ago, and they checked full panel on him, including his lipids.   Pt will still need an updated BMET done for upcoming CT Angio with our office on 3/10.  Made him a lab appt to check this on 3/6.  When he comes in for lab on 09/10/21 he will drop off the remainder of his recent lab results done by his employer, so that Dr. Johney Frame can further review and advise on those, prior to seeing her for his yearly in April.  Advised the pt that he can drop off those results to the front desk or give them to our CT Tech at his 3/10 appt with them, and either way both parties will be able to get those results to Dr. Johney Frame and myself.  Pt verbalized understanding and agrees with this plan. Will send this message to CT Dept about BMET scheduled for 3/6 and pt dropping off other lab results to them at that time, to get to Dr. Johney Frame and myself.  Will also cc Dr. Johney Frame on this message as well, as a general FYI.

## 2021-08-31 NOTE — Telephone Encounter (Signed)
[place], [place] "Jonni Sanger" - 08/31/2021  3:00 PM Livingston Diones, RT  Sent: Ludwig Clarks August 31, 2021  3:29 PM  To: Nuala Alpha, LPN          Message  OK, thank you for the update on that. If he gives the labs to [name] I will make sure that they get to you.

## 2021-09-02 NOTE — Telephone Encounter (Signed)
Sounds great.  Thank you. 

## 2021-09-10 ENCOUNTER — Other Ambulatory Visit: Payer: 59 | Admitting: *Deleted

## 2021-09-10 ENCOUNTER — Other Ambulatory Visit: Payer: Self-pay

## 2021-09-10 DIAGNOSIS — I77810 Thoracic aortic ectasia: Secondary | ICD-10-CM

## 2021-09-10 DIAGNOSIS — Z0189 Encounter for other specified special examinations: Secondary | ICD-10-CM

## 2021-09-10 DIAGNOSIS — I712 Thoracic aortic aneurysm, without rupture, unspecified: Secondary | ICD-10-CM

## 2021-09-10 LAB — BASIC METABOLIC PANEL
BUN/Creatinine Ratio: 13 (ref 9–20)
BUN: 11 mg/dL (ref 6–24)
CO2: 25 mmol/L (ref 20–29)
Calcium: 10.1 mg/dL (ref 8.7–10.2)
Chloride: 98 mmol/L (ref 96–106)
Creatinine, Ser: 0.86 mg/dL (ref 0.76–1.27)
Glucose: 101 mg/dL — ABNORMAL HIGH (ref 70–99)
Potassium: 5.2 mmol/L (ref 3.5–5.2)
Sodium: 136 mmol/L (ref 134–144)
eGFR: 109 mL/min/{1.73_m2} (ref >59)

## 2021-09-14 ENCOUNTER — Ambulatory Visit (INDEPENDENT_AMBULATORY_CARE_PROVIDER_SITE_OTHER)
Admission: RE | Admit: 2021-09-14 | Discharge: 2021-09-14 | Disposition: A | Discharge status: Home / Self Care | Payer: 59 | Source: Ambulatory Visit | Attending: Cardiology | Admitting: Cardiology

## 2021-09-14 ENCOUNTER — Other Ambulatory Visit: Payer: Self-pay

## 2021-09-14 DIAGNOSIS — I7121 Aneurysm of the ascending aorta, without rupture: Secondary | ICD-10-CM

## 2021-09-14 DIAGNOSIS — I712 Thoracic aortic aneurysm, without rupture, unspecified: Secondary | ICD-10-CM

## 2021-09-14 MED ORDER — IOHEXOL 350 MG/ML SOLN
100.0000 mL | Freq: Once | INTRAVENOUS | Status: AC | PRN
  Administered 2021-09-14: 100 mL via INTRAVENOUS

## 2021-09-20 ENCOUNTER — Telehealth: Payer: Self-pay | Admitting: *Deleted

## 2021-09-20 NOTE — Telephone Encounter (Signed)
The patient has been notified of the result and verbalized understanding.  All questions (if any) were answered. ? ?Pt aware that I will place the order for repeat echo in one year in the system and send a message to our Echo Scheduler to either recall this test or call the pt back to arrange this appt.  ?Pt verbalized understanding and agrees with this plan. ? ?

## 2021-09-20 NOTE — Telephone Encounter (Signed)
Freada Bergeron, MD  ?09/19/2021  4:31 PM EDT Back to Top  ?  ?His aortic root is stable in size. Given his family history with his father, we will continue to monitor his aorta with either yearly echoes or CT scans.  ?  ?His scan also shows calcium in his heart arteries, but this is known as he has had a Ca score in the past. He should continue his current medications.  ? ?

## 2021-09-21 ENCOUNTER — Other Ambulatory Visit: Payer: Self-pay

## 2021-09-21 MED ORDER — NEXLIZET 180-10 MG PO TABS: 1.0000 | ORAL_TABLET | Freq: Every day | ORAL | 0 refills | Status: DC

## 2021-09-22 ENCOUNTER — Other Ambulatory Visit: Payer: Self-pay | Admitting: Cardiology

## 2021-09-27 ENCOUNTER — Other Ambulatory Visit: Payer: Self-pay

## 2021-09-27 MED ORDER — LISINOPRIL 10 MG PO TABS: 10.0000 mg | ORAL_TABLET | Freq: Every day | ORAL | 0 refills | Status: DC

## 2021-10-26 NOTE — Progress Notes (Deleted)
?Cardiology Office Note:   ? ?Date:  10/26/2021  ? ?ID:  William Garrison, DOB 01-21-1976, MRN [id number] ? ?PCP:  London Pepper, MD ?  ?[place]  ?Cardiologist:  Freada Bergeron, MD  ?Advanced Practice Provider:  No care team member to display ?Electrophysiologist:  None  ? ? ?Referring MD: London Pepper, MD  ? ? ?History of Present Illness:   ? ?William Garrison is a 46 y.o. male with a hx of aortic root aneurysm, hypertension, PACs, PVCs, nonobstructive CAD by CT 09/2018, abnormal LFTs, hepatic steatosis by imaging, and family history of CAD (father having stents in his 57s) who presents to clinic for follow-up. ? ?He was seen by Dr. Meda Coffee 08/2017 for palpitations and DOE. He is a never smoker, former marathon runner. He quit running when his knees started giving him trouble. 2D echo showed normal LV function, mildly dilated aortic root, trace of AI, MR and TR. 48-hour monitor showed sinus bradycardia to sinus tachycardia with occasional PACs and PVCs felt to be essentially normal. He underwent coronary CT 09/2018 showing 91st percentile calcium score with nonobstructive mixed plaque in the proximal LAD. Medical therapy recommended. He was not previously interested in a beta blocker due to hearing about side effects. His CT did note presence of aortic root aneurysm which has been followed by serial imaging. Last study 09/09/19 showed stable size at 4.3cm as well as hepatic steatosis.  ? ?Was seen 09/2020 where he was doing well without significant CV symptoms. His CTA 09/2021 showed aortic root 3.9-4.1cm.  ? ?Today, *** ? ?Of note, his father has a history of aortic root aneurysm and is undergoing connective tissue disease genetic work-up at [facility]. Results are currently pending.  ? ?Patient's sister was also diagnosed with elevated Factor VIII  and the patient underwent testing and was also positive. No history of clots. On ASA '81mg'$  daily. ? ?Past Medical History:  ?Diagnosis Date  ? Aortic  root aneurysm   ? mild  ? Hepatic steatosis   ? Hypertension   ? Mild CAD   ? Premature atrial contractions   ? PVC's (premature ventricular contractions)   ? Vitamin B 12 deficiency   ? ? ?Past Surgical History:  ?Procedure Laterality Date  ? INGUINAL HERNIA REPAIR Bilateral 09/01/2019  ? Procedure: LAPAROSCOPIC BILATERAL INGUINAL HERNIA REPAIR WITH MESH;  Surgeon: Erroll Luna, MD;  Location: [place];  Service: General;  Laterality: Bilateral;  ? KNEE ARTHROSCOPY Left   ? x2  ? WISDOM TOOTH EXTRACTION    ? ? ?Current Medications: ?No outpatient medications have been marked as taking for the 10/30/21 encounter (Appointment) with Freada Bergeron, MD.  ?  ? ?Allergies:   Patient has no known allergies.  ? ?Social History  ? ?Socioeconomic History  ? Marital status: Married  ?  Spouse name: Not on file  ? Number of children: Not on file  ? Years of education: Not on file  ? Highest education level: Not on file  ?Occupational History  ? Not on file  ?Tobacco Use  ? Smoking status: Never  ? Smokeless tobacco: Never  ?Vaping Use  ? Vaping Use: Never used  ?Substance and Sexual Activity  ? Alcohol use: Yes  ?  Comment: 3-4 drinks daily  ? Drug use: No  ? Sexual activity: Not on file  ?  Comment: married  ?Other Topics Concern  ? Not on file  ?Social History Narrative  ? Not on file  ? ?  Social Determinants of Health  ? ?Financial Resource Strain: Not on file  ?Food Insecurity: Not on file  ?Transportation Needs: Not on file  ?Physical Activity: Not on file  ?Stress: Not on file  ?Social Connections: Not on file  ?  ? ?Family History: ?The patient's family history includes Aortic aneurysm in his father; Hypercalcemia in his mother; Hypertension in his father and mother; Stroke in his paternal grandfather. ? ?ROS:   ?Please see the history of present illness.    ?Review of Systems  ?Constitutional:  Negative for chills and fever.  ?HENT:  Negative for hearing loss.   ?Eyes:  Negative for blurred  vision and redness.  ?Respiratory:  Negative for shortness of breath.   ?Cardiovascular:  Negative for chest pain, palpitations, orthopnea, claudication, leg swelling and PND.  ?Gastrointestinal:  Negative for melena, nausea and vomiting.  ?Genitourinary:  Negative for dysuria and flank pain.  ?Musculoskeletal:  Positive for joint pain. Negative for myalgias.  ?Neurological:  Negative for dizziness and loss of consciousness.  ?Endo/Heme/Allergies:  Negative for polydipsia.  ?Psychiatric/Behavioral:  Negative for substance abuse.   ? ?EKGs/Labs/Other Studies Reviewed:   ? ?The following studies were reviewed today: ?CTA Aorta 09/2021: ?IMPRESSION: ?1. Stable diameter of the aortic root by CTA of approximately ?3.9-4.1 cm which is actually likely within normal limits. The ?ascending thoracic aorta remains normal in caliber and there is no ?evidence of thoracic aortic atherosclerosis. There is actually no ?indication for CTA follow-up on an annual basis of the thoracic ?aorta based on these measurements and occasional follow-up with ?echocardiography would likely be more indicated and would not ?involve unnecessary radiation exposure. ?2. There does appear to be mild coronary atherosclerosis with focal ?calcified plaque at the level of the LAD. Consider coronary calcium ?score study as a means of risk analysis and potential risk factor ?modification. ?3. Stable mild hepatic steatosis. ?2D Echo 09/2017 ?- Left ventricle: The cavity size was normal. Wall thickness was  ?  normal. Systolic function was normal. The estimated ejection  ?  fraction was in the range of 55% to 60%. Wall motion was normal;  ?  there were no regional wall motion abnormalities. Left  ?  ventricular diastolic function parameters were normal.  ?- Aortic valve: There was trivial regurgitation.  ?- Aortic root: The aortic root was mildly dilated.  ?- Mitral valve: Calcified annulus.  ? ?Impressions:  ? ?- Normal LV function; mildly dilated aortic  root; trace AI, MR and  ?  TR.  ?  ?CTA 09/2017 ?IMPRESSION: ?1. Stable mild aneurysmal dilation of the aortic root measuring up ?to 4.3 cm. Recommend annual imaging followup by CTA or MRA. This ?recommendation follows 2010 ?ACCF/AHA/AATS/ACR/ASA/SCA/SCAI/SIR/STS/SVM Guidelines for the ?Diagnosis and Management of Patients with Thoracic Aortic Disease. ?Circulation. 2010; 121: T557-D220. Aortic aneurysm NOS (ICD10-I71.9) ?2. Hepatic steatosis. ?  ?Coronary CTA 09/2018 ?ADDENDUM REPORT: 09/08/2018 13:55 ?  ?CLINICAL DATA:  Chest pain ?  ?EXAM: ?Cardiac CTA ?  ?MEDICATIONS: ?Sub lingual nitro. '4mg'$  x 2 ?  ?TECHNIQUE: ?The patient was scanned on a Siemens 254 slice scanner. Gantry ?rotation speed was 250 msecs. Collimation was 0.6 mm. A 100 kV ?prospective scan was triggered in the ascending thoracic aorta at ?35-75% of the R-R interval. Average HR during the scan was 60 bpm. ?The 3D data set was interpreted on a dedicated work station using ?MPR, MIP and VRT modes. A total of 80cc of contrast was used. ?  ?FINDINGS: ?Non-cardiac: See separate report from [facility]. ?  ?  Pulmonary veins drain normally to the left atrium. Enlarged aortic ?root, see radiology over-read for details. ?  ?Calcium Score: 46 Agatston units. ?  ?Coronary Arteries: Right dominant with no anomalies ?  ?LM: No plaque or stenosis. ?  ?LAD system: Mixed plaque proximal LAD with minimal stenosis. ?  ?Circumflex system: Large OM1 and OM2 with small AV LCx beyond OM2. ?No plaque or stenosis. ?  ?RCA system: No plaque or stenosis. ?  ?IMPRESSION: ?1. Coronary artery calcium score 46 Agatston units. This places the ?patient in the 91st percentile for age and gender, suggesting high ?risk for future cardiac events. ?2.  Nonobstructive coronary disease in the proximal LAD. ? ?EKG:  EKG is  ordered today.  The ekg ordered today demonstrates NSR with HR 60 ? ?Recent Labs: ?09/10/2021: BUN 11; Creatinine, Ser 0.86; Potassium 5.2; Sodium 136  ?Recent  Lipid Panel ?   ?Component Value Date/Time  ? CHOL 203 (H) 04/07/2020 1035  ? TRIG 71 04/07/2020 1035  ? HDL 107 04/07/2020 1035  ? CHOLHDL 1.9 04/07/2020 1035  ? [place] 83 04/07/2020 1035  ? ? ? ? ?Physical Exam:

## 2021-10-30 ENCOUNTER — Ambulatory Visit: Payer: 59 | Admitting: Cardiology

## 2021-10-30 ENCOUNTER — Encounter: Payer: Self-pay | Admitting: Cardiology

## 2021-10-30 VITALS — BP 136/88 | HR 66 | Ht 72.0 in | Wt 204.4 lb

## 2021-10-30 DIAGNOSIS — I77810 Thoracic aortic ectasia: Secondary | ICD-10-CM | POA: Diagnosis not present

## 2021-10-30 DIAGNOSIS — I2510 Atherosclerotic heart disease of native coronary artery without angina pectoris: Secondary | ICD-10-CM

## 2021-10-30 DIAGNOSIS — I712 Thoracic aortic aneurysm, without rupture, unspecified: Secondary | ICD-10-CM | POA: Diagnosis not present

## 2021-10-30 DIAGNOSIS — E782 Mixed hyperlipidemia: Secondary | ICD-10-CM | POA: Diagnosis not present

## 2021-10-30 DIAGNOSIS — I493 Ventricular premature depolarization: Secondary | ICD-10-CM

## 2021-10-30 DIAGNOSIS — I491 Atrial premature depolarization: Secondary | ICD-10-CM

## 2021-10-30 DIAGNOSIS — E785 Hyperlipidemia, unspecified: Secondary | ICD-10-CM

## 2021-10-30 MED ORDER — NEXLIZET 180-10 MG PO TABS: 1.0000 | ORAL_TABLET | Freq: Every day | ORAL | 3 refills | Status: DC

## 2021-10-30 MED ORDER — ROSUVASTATIN CALCIUM 20 MG PO TABS: 20.0000 mg | ORAL_TABLET | Freq: Every day | ORAL | 3 refills | Status: DC

## 2021-10-30 NOTE — Progress Notes (Signed)
?Cardiology Office Note:   ? ?Date:  10/30/2021  ? ?ID:  William Garrison, DOB January 29, 1976, MRN [id number] ? ?PCP:  London Pepper, MD ?  ?[place]  ?Cardiologist:  Freada Bergeron, MD  ?Advanced Practice Provider:  No care team member to display ?Electrophysiologist:  None  ? ? ?Referring MD: London Pepper, MD  ? ? ?History of Present Illness:   ? ?William Garrison is a 46 y.o. male with a hx of aortic root aneurysm, hypertension, PACs, PVCs, nonobstructive CAD by CT 09/2018, abnormal LFTs, hepatic steatosis by imaging, and family history of CAD (father having stents in his 41s) who presents to clinic for follow-up. ? ?He was seen by Dr. Meda Coffee 08/2017 for palpitations and DOE. He is a never smoker, former marathon runner. He quit running when his knees started giving him trouble. 2D echo showed normal LV function, mildly dilated aortic root, trace of AI, MR and TR. 48-hour monitor showed sinus bradycardia to sinus tachycardia with occasional PACs and PVCs felt to be essentially normal. He underwent coronary CT 09/2018 showing 91st percentile calcium score with nonobstructive mixed plaque in the proximal LAD. Medical therapy recommended. He was not previously interested in a beta blocker due to hearing about side effects. His CT did note presence of aortic root aneurysm which has been followed by serial imaging. Last study 09/09/19 showed stable size at 4.3cm as well as hepatic steatosis.  ? ?Was seen 09/2020 where he was doing well without significant CV symptoms. His CTA 09/2021 showed aortic root 3.9-4.1cm.  ? ?Today, he is doing well with no major complaints. His father has a history of aortic root aneurysm. His father's genetic testing came back negative for connective tissue conditions. His sister was also diagnosed with elevated Factor VIII and he underwent testing and was also positive. No history of clots. On ASA '81mg'$  daily. His daughter was screened with an ultrasound and did not have an  aortic root dilation.  ? ?Infrequently, he feels brief palpitations that cause him to become short of breath. However, these symptoms do not bother him. He denies chest pain, chest pressure, PND, orthopnea, or leg swelling.  ? ?He has not recorded his blood pressure in the past few months. When he was recording it, he noticed pressures that were low for him. He is compliant and tolerating his medications.  ? ?Past Medical History:  ?Diagnosis Date  ? Aortic root aneurysm ([place])   ? mild  ? Hepatic steatosis   ? Hypertension   ? Mild CAD   ? Premature atrial contractions   ? PVC's (premature ventricular contractions)   ? Vitamin B 12 deficiency   ? ? ?Past Surgical History:  ?Procedure Laterality Date  ? INGUINAL HERNIA REPAIR Bilateral 09/01/2019  ? Procedure: LAPAROSCOPIC BILATERAL INGUINAL HERNIA REPAIR WITH MESH;  Surgeon: Erroll Luna, MD;  Location: [facility];  Service: General;  Laterality: Bilateral;  ? KNEE ARTHROSCOPY Left   ? x2  ? WISDOM TOOTH EXTRACTION    ? ? ?Current Medications: ?Current Meds  ?Medication Sig  ? aspirin EC 81 MG tablet Take 1 tablet (81 mg total) by mouth daily.  ? carvedilol (COREG) 3.125 MG tablet Take 1 tablet (3.125 mg total) by mouth 2 (two) times daily.  ? cetirizine (ZYRTEC) 10 MG tablet Take 10 mg by mouth at bedtime.  ? Ibuprofen (ADVIL PO) Take 200 mg by mouth as needed (headache).   ? lisinopril (ZESTRIL) 10 MG tablet Take 1 tablet (  10 mg total) by mouth daily.  ? [DISCONTINUED] Bempedoic Acid-Ezetimibe (NEXLIZET) 180-10 MG TABS Take 1 tablet by mouth daily. Please keep upcoming appt with Dr. Johney Frame in April 2023 before anymore refills. Thank you Final attempt  ? [DISCONTINUED] rosuvastatin (CRESTOR) 20 MG tablet TAKE 1 TABLET BY MOUTH EVERY DAY  ?  ? ?Allergies:   Patient has no known allergies.  ? ?Social History  ? ?Socioeconomic History  ? Marital status: Married  ?  Spouse name: Not on file  ? Number of children: Not on file  ? Years of education:  Not on file  ? Highest education level: Not on file  ?Occupational History  ? Not on file  ?Tobacco Use  ? Smoking status: Never  ? Smokeless tobacco: Never  ?Vaping Use  ? Vaping Use: Never used  ?Substance and Sexual Activity  ? Alcohol use: Yes  ?  Comment: 3-4 drinks daily  ? Drug use: No  ? Sexual activity: Not on file  ?  Comment: married  ?Other Topics Concern  ? Not on file  ?Social History Narrative  ? Not on file  ? ?Social Determinants of Health  ? ?Financial Resource Strain: Not on file  ?Food Insecurity: Not on file  ?Transportation Needs: Not on file  ?Physical Activity: Not on file  ?Stress: Not on file  ?Social Connections: Not on file  ?  ? ?Family History: ?The patient's family history includes Aortic aneurysm in his father; Hypercalcemia in his mother; Hypertension in his father and mother; Stroke in his paternal grandfather. ? ?ROS:   ?Please see the history of present illness.    ?Review of Systems  ?Constitutional:  Negative for chills and fever.  ?HENT:  Negative for hearing loss.   ?Eyes:  Negative for blurred vision and redness.  ?Respiratory:  Positive for shortness of breath. Negative for sputum production.   ?Cardiovascular:  Positive for palpitations. Negative for chest pain, orthopnea, claudication, leg swelling and PND.  ?Gastrointestinal:  Negative for melena, nausea and vomiting.  ?Genitourinary:  Negative for dysuria and flank pain.  ?Musculoskeletal:  Negative for joint pain and myalgias.  ?Neurological:  Negative for dizziness and loss of consciousness.  ?Endo/Heme/Allergies:  Negative for polydipsia.  ?Psychiatric/Behavioral:  Negative for substance abuse.   ? ?EKGs/Labs/Other Studies Reviewed:   ? ?The following studies were reviewed today: ?CTA Aorta 09/2021: ?IMPRESSION: ?1. Stable diameter of the aortic root by CTA of approximately ?3.9-4.1 cm which is actually likely within normal limits. The ?ascending thoracic aorta remains normal in caliber and there is no ?evidence of  thoracic aortic atherosclerosis. There is actually no ?indication for CTA follow-up on an annual basis of the thoracic ?aorta based on these measurements and occasional follow-up with ?echocardiography would likely be more indicated and would not ?involve unnecessary radiation exposure. ?2. There does appear to be mild coronary atherosclerosis with focal ?calcified plaque at the level of the LAD. Consider coronary calcium ?score study as a means of risk analysis and potential risk factor ?modification. ?3. Stable mild hepatic steatosis. ?2D Echo 09/2017 ?- Left ventricle: The cavity size was normal. Wall thickness was  ?  normal. Systolic function was normal. The estimated ejection  ?  fraction was in the range of 55% to 60%. Wall motion was normal;  ?  there were no regional wall motion abnormalities. Left  ?  ventricular diastolic function parameters were normal.  ?- Aortic valve: There was trivial regurgitation.  ?- Aortic root: The aortic root was mildly  dilated.  ?- Mitral valve: Calcified annulus.  ? ?Impressions:  ? ?- Normal LV function; mildly dilated aortic root; trace AI, MR and  ?  TR.  ?  ?Coronary CTA 09/2018 ?ADDENDUM REPORT: 09/08/2018 13:55 ?  ?CLINICAL DATA:  Chest pain ?  ?EXAM: ?Cardiac CTA ?  ?MEDICATIONS: ?Sub lingual nitro. '4mg'$  x 2 ?  ?TECHNIQUE: ?The patient was scanned on a Siemens 828 slice scanner. Gantry ?rotation speed was 250 msecs. Collimation was 0.6 mm. A 100 kV ?prospective scan was triggered in the ascending thoracic aorta at ?35-75% of the R-R interval. Average HR during the scan was 60 bpm. ?The 3D data set was interpreted on a dedicated work station using ?MPR, MIP and VRT modes. A total of 80cc of contrast was used. ?  ?FINDINGS: ?Non-cardiac: See separate report from [facility]. ?  ?Pulmonary veins drain normally to the left atrium. Enlarged aortic ?root, see radiology over-read for details. ?  ?Calcium Score: 46 Agatston units. ?  ?Coronary Arteries: Right dominant  with no anomalies ?  ?LM: No plaque or stenosis. ?  ?LAD system: Mixed plaque proximal LAD with minimal stenosis. ?  ?Circumflex system: Large OM1 and OM2 with small AV LCx beyond OM2. ?No plaque or stenosis.

## 2021-10-30 NOTE — Patient Instructions (Signed)
Medication Instructions:   Your physician recommends that you continue on your current medications as directed. Please refer to the Current Medication list given to you today.  *If you need a refill on your cardiac medications before your next appointment, please call your pharmacy*   Follow-Up: At [facility], you and your health needs are our priority.  As part of our continuing mission to provide you with exceptional heart care, we have created designated Provider Care Teams.  These Care Teams include your primary Cardiologist (physician) and Advanced Practice Providers (APPs -  Physician Assistants and Nurse Practitioners) who all work together to provide you with the care you need, when you need it.  We recommend signing up for the patient portal called "MyChart".  Sign up information is provided on this After Visit Summary.  MyChart is used to connect with patients for Virtual Visits (Telemedicine).  Patients are able to view lab/test results, encounter notes, upcoming appointments, etc.  Non-urgent messages can be sent to your provider as well.   To learn more about what you can do with MyChart, go to [email or web address].com.    Your next appointment:   1 year(s)  The format for your next appointment:   In Person  Provider:   Heather E Pemberton, MD {   Important Information About Sugar     ' 

## 2021-12-12 ENCOUNTER — Other Ambulatory Visit: Payer: Self-pay

## 2021-12-12 ENCOUNTER — Encounter: Payer: Self-pay | Admitting: Cardiology

## 2021-12-12 DIAGNOSIS — E782 Mixed hyperlipidemia: Secondary | ICD-10-CM

## 2021-12-12 MED ORDER — CARVEDILOL 3.125 MG PO TABS: 3.1250 mg | ORAL_TABLET | Freq: Two times a day (BID) | ORAL | 3 refills | Status: DC

## 2021-12-12 MED ORDER — LISINOPRIL 10 MG PO TABS: 10.0000 mg | ORAL_TABLET | Freq: Every day | ORAL | 3 refills | Status: DC

## 2021-12-12 MED ORDER — ROSUVASTATIN CALCIUM 20 MG PO TABS: 20.0000 mg | ORAL_TABLET | Freq: Every day | ORAL | 3 refills | Status: DC

## 2022-04-05 ENCOUNTER — Telehealth: Payer: Self-pay

## 2022-04-05 ENCOUNTER — Encounter: Payer: Self-pay | Admitting: Cardiology

## 2022-04-05 NOTE — Telephone Encounter (Signed)
   Name: Jaxson Anglin  DOB: 1975/11/04  MRN: [id number]  Primary Cardiologist: Freada Bergeron, MD   Preoperative team, please contact this patient and set up a phone call appointment for further preoperative risk assessment. Please obtain consent and complete medication review. Thank you for your help.  I confirm that guidance regarding antiplatelet and oral anticoagulation therapy has been completed and, if necessary, noted below.  Regarding ASA therapy, we recommend continuation of ASA throughout the perioperative period.  However, if the surgeon feels that cessation of ASA is required in the perioperative period, and if patient is without any new symptoms or concerns at the time of their virtual visit, he may hold Aspiring for 5-7 days prior to procedure. Please resume Aspirin as soon as possible postprocedure, at the discretion of the surgeon.    Lenna Sciara, NP 04/05/2022, 2:11 PM [place]

## 2022-04-05 NOTE — Telephone Encounter (Signed)
Spoke with patient who is agreeable to do a tele visit on 10/9 at 3 pm. Med rec and conset are done.

## 2022-04-05 NOTE — Telephone Encounter (Signed)
  Patient Consent for Virtual Visit        William Garrison has provided verbal consent on 04/05/2022 for a virtual visit (video or telephone).   CONSENT FOR VIRTUAL VISIT FOR:  William Garrison  By participating in this virtual visit I agree to the following:  I hereby voluntarily request, consent and authorize [facility] and its employed or contracted physicians, physician assistants, nurse practitioners or other licensed health care professionals (the Practitioner), to provide me with telemedicine health care services (the "Services") as deemed necessary by the treating Practitioner. I acknowledge and consent to receive the Services by the Practitioner via telemedicine. I understand that the telemedicine visit will involve communicating with the Practitioner through live audiovisual communication technology and the disclosure of certain medical information by electronic transmission. I acknowledge that I have been given the opportunity to request an in-person assessment or other available alternative prior to the telemedicine visit and am voluntarily participating in the telemedicine visit.  I understand that I have the right to withhold or withdraw my consent to the use of telemedicine in the course of my care at any time, without affecting my right to future care or treatment, and that the Practitioner or I may terminate the telemedicine visit at any time. I understand that I have the right to inspect all information obtained and/or recorded in the course of the telemedicine visit and may receive copies of available information for a reasonable fee.  I understand that some of the potential risks of receiving the Services via telemedicine include:  Delay or interruption in medical evaluation due to technological equipment failure or disruption; Information transmitted may not be sufficient (e.g. poor resolution of images) to allow for appropriate medical decision making by the Practitioner;  and/or  In rare instances, security protocols could fail, causing a breach of personal health information.  Furthermore, I acknowledge that it is my responsibility to provide information about my medical history, conditions and care that is complete and accurate to the best of my ability. I acknowledge that Practitioner's advice, recommendations, and/or decision may be based on factors not within their control, such as incomplete or inaccurate data provided by me or distortions of diagnostic images or specimens that may result from electronic transmissions. I understand that the practice of medicine is not an exact science and that Practitioner makes no warranties or guarantees regarding treatment outcomes. I acknowledge that a copy of this consent can be made available to me via my patient portal ([facility]), or I can request a printed copy by calling the office of [facility].    I understand that my insurance will be billed for this visit.   I have read or had this consent read to me. I understand the contents of this consent, which adequately explains the benefits and risks of the Services being provided via telemedicine.  I have been provided ample opportunity to ask questions regarding this consent and the Services and have had my questions answered to my satisfaction. I give my informed consent for the services to be provided through the use of telemedicine in my medical care

## 2022-04-05 NOTE — Telephone Encounter (Signed)
   Pre-operative Risk Assessment    Patient Name: William Garrison  DOB: 1976/02/25 MRN: [id number]      Request for Surgical Clearance    Procedure:   LEFT MEDIAL UNICOMPARTMENTAL ARTHROPLASTY   Date of Surgery:  Clearance TBD                                 Surgeon:  DR. Jonn Shingles Surgeon's Group or Practice Name:  [facility] Phone number:  [phone]  attn: KERRI MAZE Fax number:  [phone]   Type of Clearance Requested:   - Medical  - Pharmacy:  Hold Aspirin INSTRUCTIONS WHEN TO HOLD   Type of Anesthesia:  Spinal   Additional requests/questions:    Signed, Jacinta Shoe   04/05/2022, 2:01 PM

## 2022-04-15 ENCOUNTER — Ambulatory Visit: Payer: 59 | Attending: Internal Medicine | Admitting: Nurse Practitioner

## 2022-04-15 ENCOUNTER — Encounter: Payer: Self-pay | Admitting: Nurse Practitioner

## 2022-04-15 DIAGNOSIS — Z01810 Encounter for preprocedural cardiovascular examination: Secondary | ICD-10-CM | POA: Diagnosis not present

## 2022-04-15 NOTE — Progress Notes (Signed)
Virtual Visit via Telephone Note   Because of Muadh Creasy co-morbid illnesses, he is at least at moderate risk for complications without adequate follow up.  This format is felt to be most appropriate for this patient at this time.  The patient did not have access to video technology/had technical difficulties with video requiring transitioning to audio format only (telephone).  All issues noted in this document were discussed and addressed.  No physical exam could be performed with this format.  Please refer to the patient's chart for his consent to telehealth for [facility].  Evaluation Performed:  Preoperative cardiovascular risk assessment _____________   Date:  04/15/2022   Patient ID:  William Garrison, DOB January 19, 1976, MRN [id number] Patient Location:  Home Provider location:   Office  Primary Care Provider:  London Pepper, MD Primary Cardiologist:  Freada Bergeron, MD  Chief Complaint / Patient Profile   46 y.o. 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  Past Medical History    Past Medical History:  Diagnosis Date   Aortic root aneurysm ([facility])    mild   Hepatic steatosis    Hypertension    Mild CAD    Premature atrial contractions    PVC's (premature ventricular contractions)    Vitamin B 12 deficiency    Past Surgical History:  Procedure Laterality Date   INGUINAL HERNIA REPAIR Bilateral 09/01/2019   Procedure: LAPAROSCOPIC BILATERAL INGUINAL HERNIA REPAIR WITH MESH;  Surgeon: Erroll Luna, MD;  Location: [facility];  Service: General;  Laterality: Bilateral;   KNEE ARTHROSCOPY Left    x2   WISDOM TOOTH  EXTRACTION      Allergies  No Known Allergies  History of Present Illness    William Garrison is a 46 y.o. male who presents via audio/video conferencing for a telehealth visit today.  Pt was last seen in cardiology clinic on 10/30/21 by Dr. Johney Frame.  At that time William Garrison was doing well.  The patient is now pending procedure as outlined above. Since his last visit, he  denies chest pain, shortness of breath, lower extremity edema, fatigue, palpitations, melena, hematuria, hemoptysis, diaphoresis, weakness, presyncope, syncope, orthopnea, and PND. He is limited by knee pain but is still able to achieve > 4 METS activity without cardiac symptoms.  Home Medications    Prior to Admission medications   Medication Sig Start Date End Date Taking? Authorizing Provider  aspirin EC 81 MG tablet Take 1 tablet (81 mg total) by mouth daily. 10/27/19   Dunn, Nedra Hai, PA-C  Bempedoic Acid-Ezetimibe (NEXLIZET) 180-10 MG TABS Take 1 tablet by mouth daily. 10/30/21   Freada Bergeron, MD  carvedilol (COREG) 3.125 MG tablet Take 1 tablet (3.125 mg total) by mouth 2 (two) times daily. 12/12/21   Freada Bergeron, MD  cetirizine (ZYRTEC) 10 MG tablet Take 10 mg by mouth at bedtime.    [provider]  Ibuprofen (ADVIL PO) Take 200 mg by mouth as needed (headache).     [provider]  lisinopril (ZESTRIL) 10 MG tablet Take 1 tablet (10 mg total) by mouth daily. 12/12/21   Freada Bergeron, MD  rosuvastatin (CRESTOR) 20 MG tablet Take  1 tablet (20 mg total) by mouth daily. 12/12/21   Freada Bergeron, MD    Physical Exam    Vital Signs:  William Garrison does not have vital signs available for review today.  Given telephonic nature of communication, physical exam is limited. AAOx3. NAD. Normal affect.  Speech and respirations are unlabored.  Accessory Clinical Findings    None  Assessment & Plan    1.  Preoperative Cardiovascular Risk Assessment: The patient is doing well from  a cardiac perspective. Therefore, based on ACC/AHA guidelines, the patient would be at acceptable risk for the planned procedure without further cardiovascular testing.  According to the Revised Cardiac Risk Index (RCRI), his Perioperative Risk of Major Cardiac Event is (%): 0.4. His Functional Capacity in METs is: 7.59 according to the [place] Activity Status Index (DASI).  The patient was advised that if he develops new symptoms prior to surgery to contact our office to arrange for a follow-up visit, and he verbalized understanding.  Regarding ASA therapy, we recommend continuation of ASA throughout the perioperative period.  However, if the surgeon feels that cessation of ASA is required in the perioperative period, and if patient is without any new symptoms or concerns at the time of their virtual visit, he may hold Aspiring for 5-7 days prior to procedure. Please resume Aspirin as soon as possible postprocedure, at the discretion of the surgeon. Patient advised that due to history of seeing hematology for elevated Factor VIII levels. He advised that PCP is requesting clearance from hematology. Advised him that I will forward our note of clearance to Dr. Irene Limbo and to contact his office for follow-up.   A copy of this note will be routed to requesting surgeon.  Time:   Today, I have spent 7 minutes with the patient with telehealth technology discussing medical history, symptoms, and management plan.     Emmaline Life, NP-C  04/15/2022, 2:46 PM [address], [address] Office [phone] Fax [phone]

## 2022-07-09 ENCOUNTER — Other Ambulatory Visit: Payer: Self-pay

## 2022-07-09 MED ORDER — LISINOPRIL 10 MG PO TABS: 10.0000 mg | ORAL_TABLET | Freq: Every day | ORAL | 2 refills | Status: DC

## 2022-08-12 ENCOUNTER — Telehealth: Payer: Self-pay | Admitting: *Deleted

## 2022-08-12 NOTE — Telephone Encounter (Signed)
-----   Message from Frederic Jericho sent at 08/09/2022  1:47 PM EST ----- Patient is scheduled for 09/17/22. :)

## 2022-09-17 ENCOUNTER — Ambulatory Visit (HOSPITAL_COMMUNITY): Payer: 59 | Attending: Cardiovascular Disease

## 2022-09-17 DIAGNOSIS — I77810 Thoracic aortic ectasia: Secondary | ICD-10-CM | POA: Insufficient documentation

## 2022-09-17 DIAGNOSIS — I712 Thoracic aortic aneurysm, without rupture, unspecified: Secondary | ICD-10-CM | POA: Insufficient documentation

## 2022-09-17 LAB — ECHOCARDIOGRAM COMPLETE
Area-P 1/2: 5.27 cm2
S' Lateral: 3.1 cm

## 2022-09-18 ENCOUNTER — Telehealth: Payer: Self-pay | Admitting: *Deleted

## 2022-09-18 DIAGNOSIS — I712 Thoracic aortic aneurysm, without rupture, unspecified: Secondary | ICD-10-CM

## 2022-09-18 DIAGNOSIS — I77810 Thoracic aortic ectasia: Secondary | ICD-10-CM

## 2022-09-18 NOTE — Telephone Encounter (Signed)
The patient has been notified of the result and verbalized understanding.  All questions (if any) were answered.  Pt aware that he will have a repeat echo in one year for surveillance of his aorta.  He is aware that I will go ahead and place the order for this and have our Echo Scheduler call him back to arrange this appt for one year out.   Also advised the pt to maintain good BP control, continue his med regimen, and watch his salt intake.   Pt verbalized understanding and agrees with this plan.

## 2022-09-18 NOTE — Telephone Encounter (Signed)
-----   Message from Freada Bergeron, MD sent at 09/17/2022  8:39 PM EDT ----- His echo shows norma pumping function and no significant valve disease. The aorta is mildly dilated which we will continue to monitor with yearly echoes. Will just need to continue to ensure his BP is well controlled.

## 2022-12-05 ENCOUNTER — Other Ambulatory Visit: Payer: Self-pay

## 2022-12-05 MED ORDER — CARVEDILOL 3.125 MG PO TABS: 3.1250 mg | ORAL_TABLET | Freq: Two times a day (BID) | ORAL | 1 refills | Status: DC

## 2022-12-27 ENCOUNTER — Other Ambulatory Visit: Payer: Self-pay

## 2022-12-27 DIAGNOSIS — E782 Mixed hyperlipidemia: Secondary | ICD-10-CM

## 2022-12-27 MED ORDER — NEXLIZET 180-10 MG PO TABS: 1.0000 | ORAL_TABLET | Freq: Every day | ORAL | 1 refills | Status: DC

## 2023-02-19 ENCOUNTER — Other Ambulatory Visit: Payer: Self-pay

## 2023-02-19 DIAGNOSIS — E782 Mixed hyperlipidemia: Secondary | ICD-10-CM

## 2023-02-19 MED ORDER — ROSUVASTATIN CALCIUM 20 MG PO TABS: 20.0000 mg | ORAL_TABLET | Freq: Every day | ORAL | 0 refills | Status: DC

## 2023-04-23 ENCOUNTER — Other Ambulatory Visit (HOSPITAL_BASED_OUTPATIENT_CLINIC_OR_DEPARTMENT_OTHER): Payer: Self-pay | Admitting: *Deleted

## 2023-04-23 MED ORDER — LISINOPRIL 10 MG PO TABS: 10.0000 mg | ORAL_TABLET | Freq: Every day | ORAL | 2 refills | Status: DC

## 2023-05-21 ENCOUNTER — Other Ambulatory Visit: Payer: Self-pay | Admitting: Nurse Practitioner

## 2023-05-21 DIAGNOSIS — E782 Mixed hyperlipidemia: Secondary | ICD-10-CM

## 2023-06-09 ENCOUNTER — Other Ambulatory Visit: Payer: Self-pay

## 2023-06-09 MED ORDER — CARVEDILOL 3.125 MG PO TABS: 3.1250 mg | ORAL_TABLET | Freq: Two times a day (BID) | ORAL | 0 refills | Status: DC

## 2023-06-17 ENCOUNTER — Other Ambulatory Visit: Payer: Self-pay | Admitting: Nurse Practitioner

## 2023-06-17 DIAGNOSIS — E782 Mixed hyperlipidemia: Secondary | ICD-10-CM

## 2023-06-18 ENCOUNTER — Other Ambulatory Visit: Payer: Self-pay | Admitting: Nurse Practitioner

## 2023-06-18 DIAGNOSIS — E782 Mixed hyperlipidemia: Secondary | ICD-10-CM

## 2023-06-27 ENCOUNTER — Other Ambulatory Visit: Payer: Self-pay

## 2023-06-27 DIAGNOSIS — E782 Mixed hyperlipidemia: Secondary | ICD-10-CM

## 2023-06-27 MED ORDER — NEXLIZET 180-10 MG PO TABS: 1.0000 | ORAL_TABLET | Freq: Every day | ORAL | 0 refills | Status: DC

## 2023-07-11 ENCOUNTER — Other Ambulatory Visit: Payer: Self-pay | Admitting: Nurse Practitioner

## 2023-07-11 DIAGNOSIS — E782 Mixed hyperlipidemia: Secondary | ICD-10-CM

## 2023-07-14 ENCOUNTER — Other Ambulatory Visit: Payer: Self-pay | Admitting: Nurse Practitioner

## 2023-07-14 ENCOUNTER — Encounter: Payer: Self-pay | Admitting: Cardiology

## 2023-07-14 DIAGNOSIS — E782 Mixed hyperlipidemia: Secondary | ICD-10-CM

## 2023-07-14 MED ORDER — ROSUVASTATIN CALCIUM 20 MG PO TABS: 20.0000 mg | ORAL_TABLET | Freq: Every day | ORAL | 0 refills | Status: DC

## 2023-07-14 MED ORDER — CARVEDILOL 3.125 MG PO TABS: 3.1250 mg | ORAL_TABLET | Freq: Two times a day (BID) | ORAL | 0 refills | Status: DC

## 2023-09-17 ENCOUNTER — Ambulatory Visit (HOSPITAL_COMMUNITY): Payer: 59 | Attending: Cardiology

## 2023-09-17 DIAGNOSIS — I77810 Thoracic aortic ectasia: Secondary | ICD-10-CM | POA: Diagnosis present

## 2023-09-17 DIAGNOSIS — I712 Thoracic aortic aneurysm, without rupture, unspecified: Secondary | ICD-10-CM

## 2023-09-17 LAB — ECHOCARDIOGRAM COMPLETE
Area-P 1/2: 5.54 cm2
S' Lateral: 2.7 cm

## 2023-09-18 NOTE — Progress Notes (Signed)
 Cardiology Office Note:  .   Date:  09/18/2023  ID:  William Garrison, DOB [birth year], MRN [id number] PCP: Farris Has, MD  [facility] Providers Cardiologist:  Truett Mainland, MD PCP: Farris Has, MD  Chief Complaint  Patient presents with   Dilated aortic root      History of Present Illness: .    William Garrison is a 48 y.o. 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  Patient works this job in [employer].  He does walk regularly.  In the past, used to run before having knee replacement surgery.  He does not do any regular weight lifting.  He is compliant with his medical therapy.  He does report drinking 3 glasses of wine daily.  Reviewed recent echocardiogram and prior CT scan results from 2023 with the patient, details below.    Vitals:   09/22/23 0918  BP: 130/82  Pulse: 71  SpO2: 94%     ROS:  Review of Systems  Cardiovascular:  Negative for chest pain, dyspnea on exertion, leg swelling, palpitations and syncope.     Studies Reviewed: Marland Kitchen        EKG 09/22/2023: Normal sinus rhythm Low voltage QRS Cannot rule out Anterior infarct , age undetermined When compared with ECG of 30-Aug-2019 10:58, No significant change was found  Independently interpreted 09/2021:  06/2020: Chol 217, TG 123, HDL 107, LDL 83 Hb 14.9 TSH 1.8  Independently interpreted Echocardiogram 09/2023: LVEF 60 to 65%.  GLS -19.9%, normal Aortic root 44 mm, ascending aorta 40 mm No significant valvular abnormality   CTA chest 09/2021: 1. Stable diameter of the aortic root by CTA of approximately 3.9-4.1 cm which is actually likely within normal limits. The ascending thoracic aorta remains normal in caliber and there is no evidence of thoracic aortic atherosclerosis. There is actually no indication for CTA follow-up on an annual basis of the thoracic aorta based on these  measurements and occasional follow-up with echocardiography would likely be more indicated and would not involve unnecessary radiation exposure. 2. There does appear to be mild coronary atherosclerosis with focal calcified plaque at the level of the LAD. Consider coronary calcium score study as a means of risk analysis and potential risk factor modification. 3. Stable mild hepatic steatosis.    Physical Exam:   Physical Exam Vitals and nursing note reviewed.  Constitutional:      General: He is not in acute distress. Neck:     Vascular: No JVD.  Cardiovascular:     Rate and Rhythm: Normal rate and regular rhythm.     Heart sounds: Normal heart sounds. No murmur heard. Pulmonary:     Effort: Pulmonary effort is normal.     Breath sounds: Normal breath sounds. No wheezing or rales.  Musculoskeletal:     Right lower leg: No edema.     Left lower leg: No edema.      VISIT DIAGNOSES:   ICD-10-CM   1. Mixed hyperlipidemia  E78.2 EKG 12-Lead    Lipid panel    Comprehensive metabolic panel    2. Aneurysm of ascending aorta without rupture (HCC)  I71.21 EKG 12-Lead    CT ANGIO CHEST AORTA W/CM & OR WO/CM    3. Hepatic steatosis  K76.0        ASSESSMENT AND PLAN: .    William Garrison is a 48 y.o. male with hypertension, hyperlipidemia, nonobstructive CAD, aortic root dilatation 4.1  cm, family history of TAA, hepatic steatosis and elevated LFTs.  TAA: Aortic root 44 mm on echocardiogram 09/2023. Obtain CTA aorta, previously reported to have aortic root of 4.1 cm in 09/2021. Heart rate and blood pressure well-controlled on carvedilol and lisinopril. Discussed weight lifting restrictions.  Coronary calcification, hyperlipidemia: Noted on CTA in 09/2021. Currently on Crestor 20 mg daily and next visit. Check lipid panel.  Liver enzyme elevation: Noted in the past.  Check CMP now. Patient drinks 3 glasses of wine daily.  Given presence of hepatic steatosis, strongly recommend  reducing alcohol intake.  I will refill his medications after above testing including CMP, lipid panel, and CTA.  If no calcium noted in left main artery, reasonable to omit aspirin absence of any anginal symptoms.  I will refill lipid-lowering therapy after reviewing lipid panel results.   F/u in 1 year  Signed, Elder Negus, MD

## 2023-09-22 ENCOUNTER — Ambulatory Visit: Payer: 59 | Attending: Cardiology | Admitting: Cardiology

## 2023-09-22 ENCOUNTER — Encounter: Payer: Self-pay | Admitting: Cardiology

## 2023-09-22 VITALS — BP 130/82 | HR 71 | Ht 72.0 in | Wt 207.4 lb

## 2023-09-22 DIAGNOSIS — K760 Fatty (change of) liver, not elsewhere classified: Secondary | ICD-10-CM | POA: Insufficient documentation

## 2023-09-22 DIAGNOSIS — I7121 Aneurysm of the ascending aorta, without rupture: Secondary | ICD-10-CM | POA: Diagnosis not present

## 2023-09-22 DIAGNOSIS — E782 Mixed hyperlipidemia: Secondary | ICD-10-CM | POA: Diagnosis not present

## 2023-09-22 DIAGNOSIS — I493 Ventricular premature depolarization: Secondary | ICD-10-CM | POA: Insufficient documentation

## 2023-09-22 LAB — LIPID PANEL
Chol/HDL Ratio: 1.8 ratio (ref 0.0–5.0)
Cholesterol, Total: 182 mg/dL (ref 100–199)
HDL: 103 mg/dL (ref >39)
LDL Chol Calc (NIH): 69 mg/dL (ref 0–99)
Triglycerides: 47 mg/dL (ref 0–149)
VLDL Cholesterol Cal: 10 mg/dL (ref 5–40)

## 2023-09-22 LAB — COMPREHENSIVE METABOLIC PANEL
ALT: 55 IU/L — ABNORMAL HIGH (ref 0–44)
AST: 39 IU/L (ref 0–40)
Albumin: 4.7 g/dL (ref 4.1–5.1)
Alkaline Phosphatase: 65 IU/L (ref 44–121)
BUN/Creatinine Ratio: 10 (ref 9–20)
BUN: 10 mg/dL (ref 6–24)
Bilirubin Total: 0.4 mg/dL (ref 0.0–1.2)
CO2: 23 mmol/L (ref 20–29)
Calcium: 9.8 mg/dL (ref 8.7–10.2)
Chloride: 102 mmol/L (ref 96–106)
Creatinine, Ser: 0.98 mg/dL (ref 0.76–1.27)
Globulin, Total: 2.5 g/dL (ref 1.5–4.5)
Glucose: 99 mg/dL (ref 70–99)
Potassium: 4.9 mmol/L (ref 3.5–5.2)
Sodium: 140 mmol/L (ref 134–144)
Total Protein: 7.2 g/dL (ref 6.0–8.5)
eGFR: 96 mL/min/{1.73_m2} (ref >59)

## 2023-09-22 NOTE — Progress Notes (Signed)
 Cholesterol and liver enzymes both improved. Please refill Crestor 20 mg and Nezlizet 180-10 mg daily 90 daysX3 refills.  Thanks MJP

## 2023-09-22 NOTE — Patient Instructions (Addendum)
 Lab Work: LIPID PANEL  CMP  If you have labs (blood work) drawn today and your tests are completely normal, you will receive your results only by: MyChart Message (if you have MyChart) OR A paper copy in the mail If you have any lab test that is abnormal or we need to change your treatment, we will call you to review the results.   Testing/Procedures: CT angio chest aorta   Follow-Up: At [facility], you and your health needs are our priority.  As part of our continuing mission to provide you with exceptional heart care, we have created designated Provider Care Teams.  These Care Teams include your primary Cardiologist (physician) and Advanced Practice Providers (APPs -  Physician Assistants and Nurse Practitioners) who all work together to provide you with the care you need, when you need it.    Your next appointment:   1 year(s)  Provider:   Elder Negus, MD     Other Instructions     1st Floor: - Lobby - Registration  - Pharmacy  - Lab - Cafe  2nd Floor: - PV Lab - Diagnostic Testing (echo, CT, nuclear med)  3rd Floor: - Vacant  4th Floor: - TCTS (cardiothoracic surgery) - AFib Clinic - Structural Heart Clinic - Vascular Surgery  - Vascular Ultrasound  5th Floor: - [facility] (general and EP) - Clinical Pharmacy for coumadin, hypertension, lipid, weight-loss medications, and med management appointments    Valet parking services will be available as well.

## 2023-09-23 ENCOUNTER — Other Ambulatory Visit: Payer: Self-pay

## 2023-09-23 DIAGNOSIS — E782 Mixed hyperlipidemia: Secondary | ICD-10-CM

## 2023-09-23 MED ORDER — NEXLIZET 180-10 MG PO TABS: 1.0000 | ORAL_TABLET | Freq: Every day | ORAL | 3 refills | Status: AC

## 2023-09-23 MED ORDER — ROSUVASTATIN CALCIUM 20 MG PO TABS: 20.0000 mg | ORAL_TABLET | Freq: Every day | ORAL | 3 refills | Status: AC

## 2023-09-29 ENCOUNTER — Ambulatory Visit (HOSPITAL_COMMUNITY)
Admission: RE | Admit: 2023-09-29 | Discharge: 2023-09-29 | Disposition: A | Discharge status: Home / Self Care | Source: Ambulatory Visit | Attending: Cardiology | Admitting: Cardiology

## 2023-09-29 DIAGNOSIS — I7121 Aneurysm of the ascending aorta, without rupture: Secondary | ICD-10-CM | POA: Diagnosis present

## 2023-09-29 MED ORDER — IOHEXOL 350 MG/ML SOLN
100.0000 mL | Freq: Once | INTRAVENOUS | Status: AC | PRN
  Administered 2023-09-29: 100 mL via INTRAVENOUS

## 2023-10-06 ENCOUNTER — Encounter: Payer: Self-pay | Admitting: Cardiology

## 2023-10-10 ENCOUNTER — Other Ambulatory Visit: Payer: Self-pay | Admitting: Nurse Practitioner

## 2023-10-13 ENCOUNTER — Other Ambulatory Visit: Payer: Self-pay

## 2023-10-13 DIAGNOSIS — I712 Thoracic aortic aneurysm, without rupture, unspecified: Secondary | ICD-10-CM

## 2023-10-13 NOTE — Progress Notes (Signed)
 Aorta size is stable. We can relook with echocardiogram in 1 year (TAA).  Thanks MJP

## 2024-01-13 ENCOUNTER — Other Ambulatory Visit (HOSPITAL_BASED_OUTPATIENT_CLINIC_OR_DEPARTMENT_OTHER): Payer: Self-pay | Admitting: Nurse Practitioner

## 2024-01-15 ENCOUNTER — Encounter: Payer: Self-pay | Admitting: Cardiology

## 2024-01-15 MED ORDER — LISINOPRIL 10 MG PO TABS: 10.0000 mg | ORAL_TABLET | Freq: Every day | ORAL | 2 refills | Status: AC

## 2024-01-16 NOTE — Telephone Encounter (Signed)
 Okay to refill lisinopril . No need to continue aspirin .  Thanks MJP

## 2024-01-16 NOTE — Telephone Encounter (Signed)
 Looks like lisinopril  10 mg daily.

## 2024-01-16 NOTE — Telephone Encounter (Signed)
 Which medication? Not able to see old messages on this thread.  Thanks MJP

## 2024-01-16 NOTE — Telephone Encounter (Signed)
 Pt contacted and advised. Pt declined needing refills at this time, but agrees with plan to stop aspirin .

## 2024-01-16 NOTE — Telephone Encounter (Signed)
 Please review

## 2024-08-12 ENCOUNTER — Encounter: Payer: Self-pay | Admitting: Cardiology
